# Patient Record
Sex: Male | Born: 1935 | Race: Black or African American | Hispanic: No | Marital: Married | Smoking: Former smoker
Health system: Southern US, Community
[De-identification: ages and names within clinical notes are randomized; demographics above are authoritative.]

## PROBLEM LIST (undated history)

## (undated) DIAGNOSIS — Z8711 Personal history of peptic ulcer disease: Secondary | ICD-10-CM

## (undated) DIAGNOSIS — E7800 Pure hypercholesterolemia, unspecified: Secondary | ICD-10-CM

## (undated) DIAGNOSIS — Z8719 Personal history of other diseases of the digestive system: Secondary | ICD-10-CM

## (undated) DIAGNOSIS — H409 Unspecified glaucoma: Secondary | ICD-10-CM

## (undated) DIAGNOSIS — M1990 Unspecified osteoarthritis, unspecified site: Secondary | ICD-10-CM

## (undated) DIAGNOSIS — I2510 Atherosclerotic heart disease of native coronary artery without angina pectoris: Secondary | ICD-10-CM

## (undated) DIAGNOSIS — Z87898 Personal history of other specified conditions: Secondary | ICD-10-CM

## (undated) DIAGNOSIS — K219 Gastro-esophageal reflux disease without esophagitis: Secondary | ICD-10-CM

## (undated) DIAGNOSIS — E119 Type 2 diabetes mellitus without complications: Secondary | ICD-10-CM

## (undated) DIAGNOSIS — H9190 Unspecified hearing loss, unspecified ear: Secondary | ICD-10-CM

## (undated) DIAGNOSIS — I10 Essential (primary) hypertension: Secondary | ICD-10-CM

## (undated) HISTORY — PX: CORONARY ANGIOPLASTY WITH STENT PLACEMENT: SHX49

## (undated) HISTORY — PX: COLONOSCOPY: SHX174

## (undated) HISTORY — PX: PTCA: SHX146

## (undated) HISTORY — PX: APPENDECTOMY: SHX54

---
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
## 2017-12-25 ENCOUNTER — Ambulatory Visit
Admission: EM | Admit: 2017-12-25 | Discharge: 2017-12-25 | Disposition: A | Discharge status: Home / Self Care | Payer: Medicare Other | Attending: Family Medicine | Admitting: Family Medicine

## 2017-12-25 ENCOUNTER — Encounter: Payer: Self-pay | Admitting: *Deleted

## 2017-12-25 DIAGNOSIS — M6530 Trigger finger, unspecified finger: Secondary | ICD-10-CM

## 2017-12-25 HISTORY — DX: Essential (primary) hypertension: I10

## 2017-12-25 HISTORY — DX: Type 2 diabetes mellitus without complications: E11.9

## 2017-12-25 HISTORY — DX: Atherosclerotic heart disease of native coronary artery without angina pectoris: I25.10

## 2017-12-25 NOTE — ED Triage Notes (Signed)
Pt c/o left ring finger pain and edema x7 months. States occasionally "locks" at PIP joint. Denies injury.

## 2017-12-28 ENCOUNTER — Telehealth: Payer: Self-pay

## 2017-12-28 NOTE — Telephone Encounter (Signed)
Called to follow up with patient [facility]. Patient number is not correct. Patient will call back with any questions or concerns. [facility]

## 2018-03-11 ENCOUNTER — Ambulatory Visit
Admission: EM | Admit: 2018-03-11 | Discharge: 2018-03-11 | Disposition: A | Discharge status: Home / Self Care | Payer: Medicare Other | Attending: Family Medicine | Admitting: Family Medicine

## 2018-03-11 ENCOUNTER — Encounter: Payer: Self-pay | Admitting: *Deleted

## 2018-03-11 DIAGNOSIS — M79605 Pain in left leg: Secondary | ICD-10-CM

## 2018-03-11 DIAGNOSIS — L853 Xerosis cutis: Secondary | ICD-10-CM

## 2018-03-11 MED ORDER — TRAMADOL HCL 50 MG PO TABS: 50.0000 mg | ORAL_TABLET | Freq: Three times a day (TID) | ORAL | 0 refills | Status: DC | PRN

## 2018-03-11 NOTE — Discharge Instructions (Signed)
Medicine as needed.  CeraVe for dry skin.  Take care  Dr. Adriana Simasook

## 2018-03-11 NOTE — ED Triage Notes (Signed)
Left leg pain and edema x3 weeks. Denies injury.

## 2018-03-11 NOTE — ED Provider Notes (Signed)
[facility]    CSN: [id number] Arrival date & time: 03/11/18  1013  History   Chief Complaint Chief Complaint  Patient presents with  . Leg Pain   HPI  84 year old male presents with left leg pain.  Patient reports a 3-week history of left leg pain.  He states that it starts at the back of the thigh and radiates downward.  He states that the pain is sharp.  He has numbness and tingling at times.  He reports that he has ongoing back pain as well.  He states that he saw his primary care doctor a few weeks ago and was told that this was sciatica.  He tried Aleve without improvement.  Seems to be worse with activity.  Improves with rest. No other associated symptoms.   Additionally, patient reports ongoing dry skin.  He states that he uses a diabetic cream but this is not improved.  Diffuse dry skin.  PMH: Obesity (BMI 30-39.9), unspecified 11/29/2015  Elevated PSA 04/13/2014  Overview:   - increased with testosterone Rx, back to baseline (<3) when went off   Low testosterone 01/04/2014  CKD (chronic kidney disease) stage 3, GFR 30-59 ml/min 10/02/2013  Overview:   Formatting of this note might be different from the original. Lab Results  Component Value Date  CREATININE 1.5* 09/15/2013      Primary localized osteoarthrosis, lower leg 10/06/2012  Controlled type 2 diabetes mellitus with diabetic polyneuropathy, without long-term current use of insulin 09/24/2011  Hyperlipidemia, unspecified 09/24/2011  Essential hypertension 09/24/2011  GERD (gastroesophageal reflux disease) 09/24/2011  DJD (degenerative joint disease) 09/24/2011  Chronic rhinitis 09/24/2011  ED (erectile dysfunction) 09/24/2011  CAD (coronary artery disease) 09/24/2011  Presbycusis of both ears 09/24/2011  BPH (benign prostatic hyperplasia) 09/24/2011   Past Surgical History:  Procedure Laterality Date  . APPENDECTOMY    . PTCA     Home Medications    Prior to Admission medications     Medication Sig Start Date End Date Taking? Authorizing Provider  traMADol (ULTRAM) 50 MG tablet Take 1 tablet (50 mg total) by mouth every 8 (eight) hours as needed. 03/11/18   Tommie Sams, DO   Family History Family History  Problem Relation Age of Onset  . Hypertension Mother   . Diabetes Mother   . Heart failure Mother   . Cancer Mother   . Other Father    Social History Social History   Tobacco Use  . Smoking status: Former Games developer  . Smokeless tobacco: Never Used  Substance Use Topics  . Alcohol use: Yes  . Drug use: No   Allergies   Testosterone  Review of Systems Review of Systems  Musculoskeletal:       Leg pain, back pain.  Skin:       Dry Skin.    Physical Exam Triage Vital Signs ED Triage Vitals  Enc Vitals Group     BP 03/11/18 1031 (!) 146/72     Pulse Rate 03/11/18 1031 64     Resp 03/11/18 1031 16     Temp 03/11/18 1031 (!) 97.2 F (36.2 C)     Temp Source 03/11/18 1031 Oral     SpO2 03/11/18 1031 99 %     Weight 03/11/18 1035 225 lb (102.1 kg)     Height 03/11/18 1035 5\' 11"  (1.803 m)     Head Circumference --      Peak Flow --      Pain Score 03/11/18 1034 8  Pain Loc --      Pain Edu? --      Excl. in GC? --    No data found.  Updated Vital Signs BP (!) 146/72 (BP Location: Left Arm)   Pulse 64   Temp (!) 97.2 F (36.2 C) (Oral)   Resp 16   Ht 5\' 11"  (1.803 m)   Wt 225 lb (102.1 kg)   SpO2 99%   BMI 31.38 kg/m   Physical Exam  Constitutional: He is oriented to person, place, and time. He appears well-developed. No distress.  HENT:  Head: Normocephalic and atraumatic.  Cardiovascular:  2+ DP and PT pulses (left).  Pulmonary/Chest: Effort normal. No respiratory distress.  Musculoskeletal:  Low back nontender to palpation. No discrete areas of tenderness of the left leg.  Trace ankle edema.  Neurological: He is alert and oriented to person, place, and time.  Skin:  Diffuse dry skin noted.   Psychiatric: He has a normal  mood and affect. His behavior is normal.  Nursing note and vitals reviewed.  UC Treatments / Results  Labs (all labs ordered are listed, but only abnormal results are displayed) Labs Reviewed - No data to display  EKG None Radiology No results found.  Procedures Procedures (including critical care time)  Medications Ordered in UC Medications - No data to display   Initial Impression / Assessment and Plan / UC Course  I have reviewed the triage vital signs and the nursing notes.  Pertinent labs & imaging results that were available during my care of the patient were reviewed by me and considered in my medical decision making (see chart for details).     84 year old male presents with left leg pain.  This is likely from sciatica.  Treating with tramadol.  I advised him to discuss screening for peripheral artery disease with his primary care physician.  CeraVe for dry skin.  Final Clinical Impressions(s) / UC Diagnoses   Final diagnoses:  Left leg pain  Dry skin    ED Discharge Orders        Ordered    traMADol (ULTRAM) 50 MG tablet  Every 8 hours PRN     03/11/18 1106     Controlled Substance Prescriptions [place] Controlled Substance Registry consulted? Not Applicable   Tommie SamsCook, Erinn Huskins G, DO 03/11/18 1124

## 2018-03-16 ENCOUNTER — Ambulatory Visit
Admission: EM | Admit: 2018-03-16 | Discharge: 2018-03-16 | Disposition: A | Discharge status: Home / Self Care | Payer: Medicare Other | Source: Home / Self Care | Attending: Family Medicine | Admitting: Family Medicine

## 2018-03-16 ENCOUNTER — Emergency Department
Admission: EM | Admit: 2018-03-16 | Discharge: 2018-03-16 | Disposition: A | Discharge status: Home / Self Care | Payer: Medicare Other | Attending: Emergency Medicine | Admitting: Emergency Medicine

## 2018-03-16 ENCOUNTER — Emergency Department: Payer: Medicare Other

## 2018-03-16 ENCOUNTER — Encounter: Payer: Self-pay | Admitting: Medical Oncology

## 2018-03-16 ENCOUNTER — Encounter: Payer: Self-pay | Admitting: Emergency Medicine

## 2018-03-16 ENCOUNTER — Other Ambulatory Visit: Payer: Self-pay

## 2018-03-16 DIAGNOSIS — R112 Nausea with vomiting, unspecified: Secondary | ICD-10-CM | POA: Diagnosis not present

## 2018-03-16 DIAGNOSIS — N189 Chronic kidney disease, unspecified: Secondary | ICD-10-CM | POA: Insufficient documentation

## 2018-03-16 DIAGNOSIS — I129 Hypertensive chronic kidney disease with stage 1 through stage 4 chronic kidney disease, or unspecified chronic kidney disease: Secondary | ICD-10-CM | POA: Insufficient documentation

## 2018-03-16 DIAGNOSIS — E860 Dehydration: Secondary | ICD-10-CM | POA: Diagnosis not present

## 2018-03-16 DIAGNOSIS — K8020 Calculus of gallbladder without cholecystitis without obstruction: Secondary | ICD-10-CM | POA: Insufficient documentation

## 2018-03-16 DIAGNOSIS — E1122 Type 2 diabetes mellitus with diabetic chronic kidney disease: Secondary | ICD-10-CM | POA: Diagnosis not present

## 2018-03-16 DIAGNOSIS — R1011 Right upper quadrant pain: Secondary | ICD-10-CM | POA: Insufficient documentation

## 2018-03-16 DIAGNOSIS — I2510 Atherosclerotic heart disease of native coronary artery without angina pectoris: Secondary | ICD-10-CM | POA: Insufficient documentation

## 2018-03-16 DIAGNOSIS — R197 Diarrhea, unspecified: Secondary | ICD-10-CM

## 2018-03-16 DIAGNOSIS — Z9089 Acquired absence of other organs: Secondary | ICD-10-CM | POA: Insufficient documentation

## 2018-03-16 DIAGNOSIS — N179 Acute kidney failure, unspecified: Secondary | ICD-10-CM

## 2018-03-16 DIAGNOSIS — Z87891 Personal history of nicotine dependence: Secondary | ICD-10-CM | POA: Diagnosis not present

## 2018-03-16 DIAGNOSIS — D72829 Elevated white blood cell count, unspecified: Secondary | ICD-10-CM | POA: Insufficient documentation

## 2018-03-16 LAB — COMPREHENSIVE METABOLIC PANEL
ALBUMIN: 4.5 g/dL (ref 3.5–5.0)
ALT: 22 U/L (ref 17–63)
ALT: 19 U/L (ref 17–63)
AST: 25 U/L (ref 15–41)
AST: 22 U/L (ref 15–41)
Albumin: 4.3 g/dL (ref 3.5–5.0)
Alkaline Phosphatase: 66 U/L (ref 38–126)
Alkaline Phosphatase: 66 U/L (ref 38–126)
Anion gap: 12 (ref 5–15)
Anion gap: 12 (ref 5–15)
BUN: 38 mg/dL — AB (ref 6–20)
BUN: 37 mg/dL — ABNORMAL HIGH (ref 6–20)
CHLORIDE: 97 mmol/L — AB (ref 101–111)
CO2: 30 mmol/L (ref 22–32)
CO2: 33 mmol/L — ABNORMAL HIGH (ref 22–32)
CREATININE: 1.96 mg/dL — AB (ref 0.61–1.24)
Calcium: 9.8 mg/dL (ref 8.9–10.3)
Calcium: 9.7 mg/dL (ref 8.9–10.3)
Chloride: 94 mmol/L — ABNORMAL LOW (ref 101–111)
Creatinine, Ser: 2.13 mg/dL — ABNORMAL HIGH (ref 0.61–1.24)
GFR calc Af Amer: 35 mL/min — ABNORMAL LOW (ref >60)
GFR calc non Af Amer: 30 mL/min — ABNORMAL LOW (ref >60)
GFR, EST AFRICAN AMERICAN: 32 mL/min — AB (ref >60)
GFR, EST NON AFRICAN AMERICAN: 27 mL/min — AB (ref >60)
GLUCOSE: 268 mg/dL — AB (ref 65–99)
Glucose, Bld: 230 mg/dL — ABNORMAL HIGH (ref 65–99)
Potassium: 4.1 mmol/L (ref 3.5–5.1)
Potassium: 4 mmol/L (ref 3.5–5.1)
SODIUM: 139 mmol/L (ref 135–145)
Sodium: 139 mmol/L (ref 135–145)
TOTAL PROTEIN: 7.9 g/dL (ref 6.5–8.1)
Total Bilirubin: 0.7 mg/dL (ref 0.3–1.2)
Total Bilirubin: 0.9 mg/dL (ref 0.3–1.2)
Total Protein: 8.3 g/dL — ABNORMAL HIGH (ref 6.5–8.1)

## 2018-03-16 LAB — CBC
HEMATOCRIT: 43.7 % (ref 40.0–52.0)
HEMOGLOBIN: 14.2 g/dL (ref 13.0–18.0)
MCH: 27.3 pg (ref 26.0–34.0)
MCHC: 32.4 g/dL (ref 32.0–36.0)
MCV: 84.2 fL (ref 80.0–100.0)
Platelets: 294 10*3/uL (ref 150–440)
RBC: 5.19 MIL/uL (ref 4.40–5.90)
RDW: 14.1 % (ref 11.5–14.5)
WBC: 15.5 10*3/uL — ABNORMAL HIGH (ref 3.8–10.6)

## 2018-03-16 LAB — LIPASE, BLOOD: Lipase: 43 U/L (ref 11–51)

## 2018-03-16 MED ORDER — ONDANSETRON HCL 4 MG/2ML IJ SOLN
4.0000 mg | Freq: Once | INTRAMUSCULAR | Status: AC
  Administered 2018-03-16: 4 mg via INTRAVENOUS
  Filled 2018-03-16: qty 2

## 2018-03-16 MED ORDER — ONDANSETRON 4 MG PO TBDP: 4.0000 mg | ORAL_TABLET | Freq: Three times a day (TID) | ORAL | 0 refills | Status: AC | PRN

## 2018-03-16 MED ORDER — SODIUM CHLORIDE 0.9 % IV BOLUS
1000.0000 mL | Freq: Once | INTRAVENOUS | Status: AC
  Administered 2018-03-16: 1000 mL via INTRAVENOUS

## 2018-03-16 MED ORDER — FAMOTIDINE 20 MG PO TABS
20.0000 mg | ORAL_TABLET | Freq: Two times a day (BID) | ORAL | 0 refills | Status: AC
Start: 2018-03-16 — End: ?

## 2018-03-16 MED ORDER — ONDANSETRON 8 MG PO TBDP
8.0000 mg | ORAL_TABLET | Freq: Once | ORAL | Status: AC
  Administered 2018-03-16: 8 mg via ORAL

## 2018-03-16 MED ORDER — FAMOTIDINE IN NACL 20-0.9 MG/50ML-% IV SOLN
20.0000 mg | Freq: Once | INTRAVENOUS | Status: AC
  Administered 2018-03-16: 20 mg via INTRAVENOUS
  Filled 2018-03-16: qty 50

## 2018-03-16 MED ORDER — ONDANSETRON 4 MG PO TBDP: 4.0000 mg | ORAL_TABLET | Freq: Three times a day (TID) | ORAL | 0 refills | Status: DC | PRN

## 2018-03-16 NOTE — ED Notes (Signed)
Patient transported to Ultrasound 

## 2018-03-16 NOTE — ED Triage Notes (Signed)
First Nurse Note:  [facility] for ED evaluation of possible dehydration.  Patient states he has been vomiting since Friday. AAOx3.  Skin warm and dry. NAD

## 2018-03-16 NOTE — Discharge Instructions (Signed)
Go to the hospital for IV fluids and monitoring.  Take care  Dr. Adriana Simasook

## 2018-03-16 NOTE — ED Triage Notes (Signed)
[facility], pt states that he has been having diarrhea for a few days and vomiting since Friday. Pt in NAD at this time.

## 2018-03-16 NOTE — ED Triage Notes (Signed)
Patient c/o vomiting since Friday.  Patient reports diarrhea off and on for a week.

## 2018-03-16 NOTE — ED Notes (Signed)
Reviewed discharge instructions, follow-up care, and prescriptions with patient. Patient verbalized understanding of all information reviewed. Patient stable, with no distress noted at this time.    

## 2018-03-16 NOTE — ED Notes (Signed)
ED Provider at bedside. 

## 2018-03-16 NOTE — ED Provider Notes (Signed)
[facility] Emergency Department Provider Note  ____________________________________________  Time seen: Approximately 7:24 PM  I have reviewed the triage vital signs and the nursing notes.   HISTORY  Chief Complaint Emesis and Diarrhea    HPI Ronald Mcdonald is a 84 y.o. male sent to the ED from urgent care due to vomiting and diarrhea for the past 3 days. Patient has a history of coronary artery disease, hypertension, diabetes.  Patient reports intermittent cramping abdominal pain, lasts for a minute or 2 and then resolves, frequently recurrent. Nonradiating, no aggravating or alleviating factors, moderate intensity. Associated with watery diarrhea, about 5 episodes a day as well as vomiting whenever he tries to eat or drink anything. Denies fevers chills or dizziness.      Past Medical History:  Diagnosis Date  . Coronary artery disease   . Diabetes mellitus without complication (HCC)   . Hypertension      There are no active problems to display for this patient.    Past Surgical History:  Procedure Laterality Date  . APPENDECTOMY    . PTCA       Prior to Admission medications   Medication Sig Start Date End Date Taking? Authorizing Provider  famotidine (PEPCID) 20 MG tablet Take 1 tablet (20 mg total) by mouth 2 (two) times daily. 03/16/18   Sharman Cheek, MD  ondansetron (ZOFRAN ODT) 4 MG disintegrating tablet Take 1 tablet (4 mg total) by mouth every 8 (eight) hours as needed for nausea or vomiting. 03/16/18   Sharman Cheek, MD  ondansetron (ZOFRAN-ODT) 4 MG disintegrating tablet Take 1 tablet (4 mg total) by mouth every 8 (eight) hours as needed for nausea or vomiting. 03/16/18   Tommie Sams, DO  traMADol (ULTRAM) 50 MG tablet Take 1 tablet (50 mg total) by mouth every 8 (eight) hours as needed. 03/11/18   Tommie Sams, DO     Allergies Testosterone   Family History  Problem Relation Age of Onset  . Hypertension Mother   .  Diabetes Mother   . Heart failure Mother   . Cancer Mother   . Other Father     Social History Social History   Tobacco Use  . Smoking status: Former Games developer  . Smokeless tobacco: Never Used  Substance Use Topics  . Alcohol use: Yes  . Drug use: No    Review of Systems  Constitutional:   No fever or chills.  ENT:   No sore throat. No rhinorrhea. Cardiovascular:   No chest pain or syncope. Respiratory:   No dyspnea or cough. Gastrointestinal:   positive as above for abdominal pain and vomiting and diarrhea.  Musculoskeletal:   Negative for focal pain or swelling All other systems reviewed and are negative except as documented above in ROS and HPI.  ____________________________________________   PHYSICAL EXAM:  VITAL SIGNS: ED Triage Vitals [03/16/18 1806]  Enc Vitals Group     BP (!) 135/58     Pulse Rate 87     Resp 16     Temp 98.7 F (37.1 C)     Temp Source Oral     SpO2 97 %     Weight 225 lb (102.1 kg)     Height 5\' 11"  (1.803 m)     Head Circumference      Peak Flow      Pain Score 10     Pain Loc      Pain Edu?      Excl. in  GC?     Vital signs reviewed, nursing assessments reviewed.   Constitutional:   Alert and oriented. Well appearing and in no distress. Eyes:   Conjunctivae are normal. EOMI. PERRL. ENT      Head:   Normocephalic and atraumatic.      Nose:   No congestion/rhinnorhea.       Mouth/Throat:   MMM, no pharyngeal erythema. No peritonsillar mass.       Neck:   No meningismus. Full ROM. Hematological/Lymphatic/Immunilogical:   No cervical lymphadenopathy. Cardiovascular:   RRR. Symmetric bilateral radial and DP pulses.  No murmurs.  Respiratory:   Normal respiratory effort without tachypnea/retractions. Breath sounds are clear and equal bilaterally. No wheezes/rales/rhonchi. Gastrointestinal:   Soft and nontender. Non distended. There is no CVA tenderness.  No rebound, rigidity, or guarding. Genitourinary:    deferred Musculoskeletal:   Normal range of motion in all extremities. No joint effusions.  No lower extremity tenderness.  No edema. Neurologic:   Normal speech and language.  Motor grossly intact. No acute focal neurologic deficits are appreciated.  Skin:    Skin is warm, dry and intact. No rash noted.  No petechiae, purpura, or bullae.  ____________________________________________    LABS (pertinent positives/negatives) (all labs ordered are listed, but only abnormal results are displayed) Labs Reviewed  COMPREHENSIVE METABOLIC PANEL - Abnormal; Notable for the following components:      Result Value   Chloride 94 (*)    CO2 33 (*)    Glucose, Bld 268 (*)    BUN 37 (*)    Creatinine, Ser 2.13 (*)    GFR calc non Af Amer 27 (*)    GFR calc Af Amer 32 (*)    All other components within normal limits  CBC - Abnormal; Notable for the following components:   WBC 15.5 (*)    All other components within normal limits  LIPASE, BLOOD  URINALYSIS, COMPLETE (UACMP) WITH MICROSCOPIC   ____________________________________________   EKG    ____________________________________________    RADIOLOGY  Ct Abdomen Pelvis Wo Contrast  Result Date: 03/16/2018 CLINICAL DATA:  Vomiting since Friday with diarrhea on and off for a week. History of appendectomy. EXAM: CT ABDOMEN AND PELVIS WITHOUT CONTRAST TECHNIQUE: Multidetector CT imaging of the abdomen and pelvis was performed following the standard protocol without IV contrast. COMPARISON:  None. FINDINGS: Lower chest: Top-normal size heart without pericardial effusion. Minimal atelectasis at the right lung base. 4 mm nodular density along the right major fissure in the right lower lobe likely representing a fissural lymph node, series 3/1. Hepatobiliary: Dependent calculi biliary sludge within the gallbladder without secondary signs of acute cholecystitis. Homogeneous appearance of the unenhanced liver. No definite space-occupying mass or  biliary dilatation. Pancreas: No inflammation, ductal dilatation or pancreatic mass. Spleen: Normal size spleen without mass. Adrenals/Urinary Tract: Normal bilateral adrenal glands. Water attenuating cysts bilaterally the largest in the lower pole measuring up to 6.3 cm. No hydroureteronephrosis. Unremarkable bladder for degree of distention. Stomach/Bowel: Stomach is within normal limits. Appendix is surgically absent. No evidence of bowel wall thickening, distention, or inflammatory changes. Vascular/Lymphatic: Moderate aortoiliac atherosclerosis without aneurysm. No lymphadenopathy. Reproductive: Normal size prostate and seminal vesicles. Other: No abdominal wall hernia or abnormality. No abdominopelvic ascites. Musculoskeletal: Degenerative disc disease L5-S1. No pars defects. No acute nor suspicious osseous abnormality. IMPRESSION: 1. No acute solid nor hollow visceral organ abnormality. No bowel obstruction or bowel inflammation. 2. Biliary sludge and calculi within a nondistended gallbladder. No secondary signs of  acute cholecystitis. 3. Bilateral water attenuating renal cysts largest in the lower pole measuring 6.3 cm. Electronically Signed   By: Tollie Eth M.D.   On: 03/16/2018 20:05   US Abdomen Limited Ruq  Result Date: 03/16/2018 CLINICAL DATA:  Right upper quadrant pain. EXAM: ULTRASOUND ABDOMEN LIMITED RIGHT UPPER QUADRANT COMPARISON:  CT scan from earlier today FINDINGS: Gallbladder: Multiple shadowing gallstones are identified measuring up to 15 mm diameter. No gallbladder wall thickening or pericholecystic fluid. The sonographer reports no sonographic Murphy sign. Common bile duct: Diameter: 5 mm Liver: No focal lesion identified. Increased parenchymal echogenicity suggests fatty deposition. Portal vein is patent on color Doppler imaging with normal direction of blood flow towards the liver. Other: Cystic lesions identified in the right kidney. IMPRESSION: 1. Cholelithiasis without gallbladder  wall thickening or pericholecystic fluid. No biliary dilatation. 2. Diffusely increased echogenicity of the liver parenchyma suggests fatty deposition. Electronically Signed   By: Kennith Center M.D.   On: 03/16/2018 21:39    ____________________________________________   PROCEDURES Procedures  ____________________________________________  DIFFERENTIAL DIAGNOSIS   bowel obstruction, AAA, gastroenteritis, biliary disease, diverticulitis. Low suspicion ACS PE dissection or pneumothorax or mesenteric ischemia.  CLINICAL IMPRESSION / ASSESSMENT AND PLAN / ED COURSE  Pertinent labs & imaging results that were available during my care of the patient were reviewed by me and considered in my medical decision making (see chart for details).    patient presents with occasional abdominal pain, vomiting and diarrhea. No risk factors for C. difficile infection.hydration with IV fluids. CT abdomen and pelvis given leukocytosis with white blood cell count of 15.5 thousand. Has acute on chronic kidney disease with creatinine elevated from baseline of 1.5-2.1.  Clinical Course as of Mar 16 2304  Wynelle Link Mar 16, 2018  1845 Increase from baseline ckd of creatinine 1.5. Acute on chronic renal failure.   Creatinine(!): 2.13 [PS]  2057 Ct shows GB sludge. Given sx, wbc 15, will check [place] ruq. May PO trial if reassuring.    [PS]  2207 [place] c/w cholelithiasis without cholecystitis. Exam not highly concerning for cholecystitis. Plan to DC home if feeling better, f/u outpt surgery   [PS]  2215 Pt tolerating oral intake.    [PS]    Clinical Course User Index [PS] Sharman Cheek, MD     ----------------------------------------- 11:05 PM on 03/16/2018 -----------------------------------------  Results and outpatient follow-up plan discussed with the patient and his wife at bedside. They agree and are eager to leave. We'll follow up with primary care. Follow bland diet. Return precautions  discussed.  ____________________________________________   FINAL CLINICAL IMPRESSION(S) / ED DIAGNOSES    Final diagnoses:  Non-intractable vomiting with nausea, unspecified vomiting type  Dehydration  Calculus of gallbladder without cholecystitis without obstruction  Chronic kidney disease, unspecified CKD stage     ED Discharge Orders        Ordered    famotidine (PEPCID) 20 MG tablet  2 times daily     03/16/18 2301    ondansetron (ZOFRAN ODT) 4 MG disintegrating tablet  Every 8 hours PRN     03/16/18 2301      Portions of this note were generated with dragon dictation software. Dictation errors may occur despite best attempts at proofreading.    Sharman Cheek, MD 03/16/18 [id number]

## 2018-03-16 NOTE — ED Notes (Signed)
Patient transported to CT 

## 2018-03-16 NOTE — ED Provider Notes (Signed)
[facility]  CSN: [id number] Arrival date & time: 03/16/18  1552  History   Chief Complaint Chief Complaint  Patient presents with  . Emesis  . Diarrhea   HPI  84 year old male with type 2 diabetes, CAD, CKD, hypertension, hyperlipidemia presents with nausea, vomiting, diarrhea.  Patient states that he has had ongoing diarrhea for the past week.  On Friday he developed nausea and vomiting.  He states that he has been having difficulty keeping food down.  He has been able to take liquids but states that eventually throws that up as well.  He reports intermittent abdominal pain.  He states that his pain is currently 4/10 in severity.  No fever.  No chills.  Does not feel well.  No new medication changes other than recent addition of tramadol for pain.  No other associated symptoms.  No other complaints or concerns at this time.  Social History Social History   Tobacco Use  . Smoking status: Former Games developermoker  . Smokeless tobacco: Never Used  Substance Use Topics  . Alcohol use: Yes  . Drug use: No   Allergies   Testosterone  Review of Systems Review of Systems  Constitutional: Positive for fatigue. Negative for fever.  Gastrointestinal: Positive for abdominal pain, diarrhea, nausea and vomiting.   Physical Exam Triage Vital Signs ED Triage Vitals  Enc Vitals Group     BP 03/16/18 1559 129/70     Pulse Rate 03/16/18 1559 93     Resp 03/16/18 1559 16     Temp 03/16/18 1559 98.2 F (36.8 C)     Temp Source 03/16/18 1559 Oral     SpO2 03/16/18 1559 95 %     Weight 03/16/18 1558 225 lb (102.1 kg)     Height --      Head Circumference --      Peak Flow --      Pain Score 03/16/18 1558 4     Pain Loc --      Pain Edu? --      Excl. in GC? --    Updated Vital Signs BP 129/70 (BP Location: Left Arm)   Pulse 93   Temp 98.2 F (36.8 C) (Oral)   Resp 16   Wt 225 lb (102.1 kg)   SpO2 95%   BMI 31.38 kg/m   Physical Exam  Constitutional: He is oriented to  person, place, and time. He appears well-developed. No distress.  HENT:  Head: Normocephalic and atraumatic.  Eyes: Conjunctivae are normal. No scleral icterus.  Cardiovascular: Normal rate and regular rhythm.  Pulmonary/Chest: Effort normal and breath sounds normal. He has no wheezes. He has no rales.  Abdominal: Soft. He exhibits no distension. There is no tenderness. There is no rebound and no guarding.  Neurological: He is alert and oriented to person, place, and time.  Psychiatric: He has a normal mood and affect. His behavior is normal.  Nursing note and vitals reviewed.  UC Treatments / Results  Labs (all labs ordered are listed, but only abnormal results are displayed) Labs Reviewed  COMPREHENSIVE METABOLIC PANEL - Abnormal; Notable for the following components:      Result Value   Chloride 97 (*)    Glucose, Bld 230 (*)    BUN 38 (*)    Creatinine, Ser 1.96 (*)    Total Protein 8.3 (*)    GFR calc non Af Amer 30 (*)    GFR calc Af Amer 35 (*)    All  other components within normal limits    EKG None Radiology No results found.  Procedures Procedures (including critical care time)  Medications Ordered in UC Medications  ondansetron (ZOFRAN-ODT) disintegrating tablet 8 mg (8 mg Oral Given 03/16/18 1606)   Initial Impression / Assessment and Plan / UC Course  I have reviewed the triage vital signs and the nursing notes.  Pertinent labs & imaging results that were available during my care of the patient were reviewed by me and considered in my medical decision making (see chart for details).    84 year old male presents with nausea, vomiting, diarrhea. Creatinine bump (AKI) from baseline of 1.5 to 1.96. Sending to ER for fluids and monitoring.  Final Clinical Impressions(s) / UC Diagnoses   Final diagnoses:  Nausea vomiting and diarrhea  AKI (acute kidney injury) [facility])   ED Discharge Orders        Ordered    ondansetron (ZOFRAN-ODT) 4 MG disintegrating tablet   Every 8 hours PRN     03/16/18 1627     Controlled Substance Prescriptions [place] Controlled Substance Registry consulted? Not Applicable   Tommie Sams, DO 03/16/18 1640

## 2018-04-02 NOTE — ED Provider Notes (Signed)
[facility]    CSN: [id number] Arrival date & time: 12/25/17  1616     History   Chief Complaint Chief Complaint  Patient presents with  . Hand Pain    HPI Ronald Mcdonald is a 84 y.o. male.   84 yo male with a c/o left ring finger pain and slight swelling fo rthe past 7-8 months. Denies any injuries/trauma. States sometimes it "locks up" and he has to push or pull the joint to release it. Denies any fevers or chills.   The history is provided by the patient.  Hand Pain     Past Medical History:  Diagnosis Date  . Coronary artery disease   . Diabetes mellitus without complication (HCC)   . Hypertension     There are no active problems to display for this patient.   Past Surgical History:  Procedure Laterality Date  . APPENDECTOMY    . PTCA         Home Medications    Prior to Admission medications   Medication Sig Start Date End Date Taking? Authorizing Provider  famotidine (PEPCID) 20 MG tablet Take 1 tablet (20 mg total) by mouth 2 (two) times daily. 03/16/18   Sharman CheekStafford, Phillip, MD  ondansetron (ZOFRAN ODT) 4 MG disintegrating tablet Take 1 tablet (4 mg total) by mouth every 8 (eight) hours as needed for nausea or vomiting. 03/16/18   Sharman CheekStafford, Phillip, MD  ondansetron (ZOFRAN-ODT) 4 MG disintegrating tablet Take 1 tablet (4 mg total) by mouth every 8 (eight) hours as needed for nausea or vomiting. 03/16/18   Tommie Samsook, Jayce G, DO  traMADol (ULTRAM) 50 MG tablet Take 1 tablet (50 mg total) by mouth every 8 (eight) hours as needed. 03/11/18   Tommie Samsook, Jayce G, DO    Family History Family History  Problem Relation Age of Onset  . Hypertension Mother   . Diabetes Mother   . Heart failure Mother   . Cancer Mother   . Other Father     Social History Social History   Tobacco Use  . Smoking status: Former Games developermoker  . Smokeless tobacco: Never Used  Substance Use Topics  . Alcohol use: Yes  . Drug use: No     Allergies   Testosterone   Review of  Systems Review of Systems   Physical Exam Triage Vital Signs ED Triage Vitals  Enc Vitals Group     BP 12/25/17 1714 (!) 152/63     Pulse Rate 12/25/17 1714 60     Resp 12/25/17 1714 16     Temp 12/25/17 1714 98.6 F (37 C)     Temp Source 12/25/17 1714 Oral     SpO2 12/25/17 1714 98 %     Weight 12/25/17 1717 226 lb (102.5 kg)     Height 12/25/17 1717 5\' 11"  (1.803 m)     Head Circumference --      Peak Flow --      Pain Score --      Pain Loc --      Pain Edu? --      Excl. in GC? --    No data found.  Updated Vital Signs BP (!) 152/63 (BP Location: Left Arm)   Pulse 60   Temp 98.6 F (37 C) (Oral)   Resp 16   Ht 5\' 11"  (1.803 m)   Wt 226 lb (102.5 kg)   SpO2 98%   BMI 31.52 kg/m   Visual Acuity Right Eye Distance:  Left Eye Distance:   Bilateral Distance:    Right Eye Near:   Left Eye Near:    Bilateral Near:     Physical Exam  Constitutional: He appears well-developed and well-nourished. No distress.  Musculoskeletal:       Left hand: He exhibits normal range of motion, no tenderness, no bony tenderness, normal two-point discrimination, normal capillary refill, no deformity, no laceration and no swelling. Normal sensation noted. Normal strength noted.  PIP joint of left right finger will lock up in flexion with subsequent release with mild force  Skin: He is not diaphoretic.  Nursing note and vitals reviewed.    UC Treatments / Results  Labs (all labs ordered are listed, but only abnormal results are displayed) Labs Reviewed - No data to display  EKG None Radiology No results found.  Procedures Procedures (including critical care time)  Medications Ordered in UC Medications - No data to display   Initial Impression / Assessment and Plan / UC Course  I have reviewed the triage vital signs and the nursing notes.  Pertinent labs & imaging results that were available during my care of the patient were reviewed by me and considered in my  medical decision making (see chart for details).       Final Clinical Impressions(s) / UC Diagnoses   Final diagnoses:  Trigger finger, acquired    ED Discharge Orders    None     1. diagnosis reviewed with patient 2. Recommend supportive treatment with otc analgesics prn  3. Recommend follow up with orthopedist for further evaluation and management (possible injection) 4. Follow-up prn if symptoms worsen or don't improve   Controlled Substance Prescriptions [place] Controlled Substance Registry consulted? Not Applicable   Payton Mccallum, MD 04/02/18 [phone]

## 2019-07-09 ENCOUNTER — Other Ambulatory Visit
Admission: RE | Admit: 2019-07-09 | Discharge: 2019-07-09 | Disposition: A | Discharge status: Home / Self Care | Payer: Medicare Other | Source: Ambulatory Visit | Attending: Ophthalmology | Admitting: Ophthalmology

## 2019-07-09 ENCOUNTER — Other Ambulatory Visit: Payer: Self-pay

## 2019-07-09 DIAGNOSIS — Z20828 Contact with and (suspected) exposure to other viral communicable diseases: Secondary | ICD-10-CM | POA: Diagnosis present

## 2019-07-09 LAB — SARS CORONAVIRUS 2 (TAT 6-24 HRS): SARS Coronavirus 2: NEGATIVE

## 2019-07-09 NOTE — Discharge Instructions (Signed)

## 2019-07-13 ENCOUNTER — Encounter
Admission: RE | Disposition: A | Discharge status: Home / Self Care | Payer: Self-pay | Source: Home / Self Care | Attending: Ophthalmology

## 2019-07-13 ENCOUNTER — Ambulatory Visit: Payer: Medicare Other | Admitting: Anesthesiology

## 2019-07-13 ENCOUNTER — Ambulatory Visit
Admission: RE | Admit: 2019-07-13 | Discharge: 2019-07-13 | Disposition: A | Discharge status: Home / Self Care | Payer: Medicare Other | Attending: Ophthalmology | Admitting: Ophthalmology

## 2019-07-13 ENCOUNTER — Other Ambulatory Visit: Payer: Self-pay

## 2019-07-13 DIAGNOSIS — H2181 Floppy iris syndrome: Secondary | ICD-10-CM | POA: Insufficient documentation

## 2019-07-13 DIAGNOSIS — Z888 Allergy status to other drugs, medicaments and biological substances status: Secondary | ICD-10-CM | POA: Insufficient documentation

## 2019-07-13 DIAGNOSIS — Z79899 Other long term (current) drug therapy: Secondary | ICD-10-CM | POA: Insufficient documentation

## 2019-07-13 DIAGNOSIS — Z87891 Personal history of nicotine dependence: Secondary | ICD-10-CM | POA: Insufficient documentation

## 2019-07-13 DIAGNOSIS — Z955 Presence of coronary angioplasty implant and graft: Secondary | ICD-10-CM | POA: Diagnosis not present

## 2019-07-13 DIAGNOSIS — I10 Essential (primary) hypertension: Secondary | ICD-10-CM | POA: Diagnosis not present

## 2019-07-13 DIAGNOSIS — E7800 Pure hypercholesterolemia, unspecified: Secondary | ICD-10-CM | POA: Diagnosis not present

## 2019-07-13 DIAGNOSIS — Z7982 Long term (current) use of aspirin: Secondary | ICD-10-CM | POA: Insufficient documentation

## 2019-07-13 DIAGNOSIS — K219 Gastro-esophageal reflux disease without esophagitis: Secondary | ICD-10-CM | POA: Insufficient documentation

## 2019-07-13 DIAGNOSIS — H2511 Age-related nuclear cataract, right eye: Secondary | ICD-10-CM | POA: Diagnosis present

## 2019-07-13 DIAGNOSIS — M1990 Unspecified osteoarthritis, unspecified site: Secondary | ICD-10-CM | POA: Diagnosis not present

## 2019-07-13 DIAGNOSIS — E1136 Type 2 diabetes mellitus with diabetic cataract: Secondary | ICD-10-CM | POA: Insufficient documentation

## 2019-07-13 DIAGNOSIS — Z794 Long term (current) use of insulin: Secondary | ICD-10-CM | POA: Diagnosis not present

## 2019-07-13 HISTORY — DX: Personal history of other specified conditions: Z87.898

## 2019-07-13 HISTORY — DX: Gastro-esophageal reflux disease without esophagitis: K21.9

## 2019-07-13 HISTORY — DX: Unspecified hearing loss, unspecified ear: H91.90

## 2019-07-13 HISTORY — PX: CATARACT EXTRACTION W/PHACO: SHX586

## 2019-07-13 HISTORY — DX: Personal history of peptic ulcer disease: Z87.11

## 2019-07-13 HISTORY — DX: Unspecified osteoarthritis, unspecified site: M19.90

## 2019-07-13 HISTORY — DX: Pure hypercholesterolemia, unspecified: E78.00

## 2019-07-13 HISTORY — DX: Unspecified glaucoma: H40.9

## 2019-07-13 LAB — GLUCOSE, CAPILLARY
Glucose-Capillary: 164 mg/dL — ABNORMAL HIGH (ref 70–99)
Glucose-Capillary: 185 mg/dL — ABNORMAL HIGH (ref 70–99)

## 2019-07-13 SURGERY — PHACOEMULSIFICATION, CATARACT, WITH IOL INSERTION
Anesthesia: Monitor Anesthesia Care | Site: Eye | Laterality: Right

## 2019-07-13 MED ORDER — MIDAZOLAM HCL 2 MG/2ML IJ SOLN
INTRAMUSCULAR | Status: DC | PRN
  Administered 2019-07-13: 1 mg via INTRAVENOUS

## 2019-07-13 MED ORDER — ARMC OPHTHALMIC DILATING DROPS
1.0000 "application " | OPHTHALMIC | Status: DC | PRN
  Administered 2019-07-13 (×3): 1 via OPHTHALMIC

## 2019-07-13 MED ORDER — SODIUM HYALURONATE 23 MG/ML IO SOLN
INTRAOCULAR | Status: DC | PRN
  Administered 2019-07-13: 0.6 mL via INTRAOCULAR

## 2019-07-13 MED ORDER — PHENYLEPHRINE HCL 10 % OP SOLN
1.0000 [drp] | OPHTHALMIC | Status: DC | PRN
  Administered 2019-07-13 (×2): 1 [drp] via OPHTHALMIC

## 2019-07-13 MED ORDER — TETRACAINE HCL 0.5 % OP SOLN
1.0000 [drp] | OPHTHALMIC | Status: DC | PRN
  Administered 2019-07-13 (×3): 1 [drp] via OPHTHALMIC

## 2019-07-13 MED ORDER — MOXIFLOXACIN HCL 0.5 % OP SOLN
OPHTHALMIC | Status: DC | PRN
  Administered 2019-07-13: 0.2 mL via OPHTHALMIC

## 2019-07-13 MED ORDER — FENTANYL CITRATE (PF) 100 MCG/2ML IJ SOLN
INTRAMUSCULAR | Status: DC | PRN
  Administered 2019-07-13: 50 ug via INTRAVENOUS

## 2019-07-13 MED ORDER — EPINEPHRINE PF 1 MG/ML IJ SOLN
INTRAOCULAR | Status: DC | PRN
  Administered 2019-07-13: 104 mL via OPHTHALMIC

## 2019-07-13 MED ORDER — LIDOCAINE HCL (PF) 2 % IJ SOLN
INTRAOCULAR | Status: DC | PRN
  Administered 2019-07-13: 1 mL via INTRAOCULAR

## 2019-07-13 MED ORDER — SODIUM HYALURONATE 10 MG/ML IO SOLN
INTRAOCULAR | Status: DC | PRN
  Administered 2019-07-13: 0.55 mL via INTRAOCULAR

## 2019-07-13 SURGICAL SUPPLY — 20 items
CANNULA ANT/CHMB 27G (MISCELLANEOUS) ×2 IMPLANT
CANNULA ANT/CHMB 27GA (MISCELLANEOUS) ×6 IMPLANT
DISSECTOR HYDRO NUCLEUS 50X22 (MISCELLANEOUS) ×3 IMPLANT
GLOVE SURG LX 7.5 STRW (GLOVE) ×2
GLOVE SURG LX STRL 7.5 STRW (GLOVE) ×1 IMPLANT
GLOVE SURG SYN 8.5  E (GLOVE) ×2
GLOVE SURG SYN 8.5 E (GLOVE) ×1 IMPLANT
GLOVE SURG SYN 8.5 PF PI (GLOVE) ×1 IMPLANT
GOWN STRL REUS W/ TWL LRG LVL3 (GOWN DISPOSABLE) ×2 IMPLANT
GOWN STRL REUS W/TWL LRG LVL3 (GOWN DISPOSABLE) ×4
LENS IOL TECNIS ITEC 25.0 (Intraocular Lens) ×2 IMPLANT
MARKER SKIN DUAL TIP RULER LAB (MISCELLANEOUS) ×3 IMPLANT
PACK DR. KING ARMS (PACKS) ×3 IMPLANT
PACK EYE AFTER SURG (MISCELLANEOUS) ×3 IMPLANT
PACK OPTHALMIC (MISCELLANEOUS) ×3 IMPLANT
RING MALYGIN 7.0 (MISCELLANEOUS) ×2 IMPLANT
SYR 3ML LL SCALE MARK (SYRINGE) ×3 IMPLANT
SYR TB 1ML LUER SLIP (SYRINGE) ×3 IMPLANT
WATER STERILE IRR 250ML POUR (IV SOLUTION) ×3 IMPLANT
WIPE NON LINTING 3.25X3.25 (MISCELLANEOUS) ×3 IMPLANT

## 2019-07-13 NOTE — Anesthesia Preprocedure Evaluation (Signed)
Anesthesia Evaluation    Airway Mallampati: III  TM Distance: >3 FB Neck ROM: Full    Dental  (+) Partial Lower, Partial Upper   Pulmonary former smoker,    Pulmonary exam normal        Cardiovascular hypertension, Normal cardiovascular exam     Neuro/Psych    GI/Hepatic GERD  ,  Endo/Other  diabetes  Renal/GU      Musculoskeletal   Abdominal   Peds  Hematology   Anesthesia Other Findings   Reproductive/Obstetrics                            Anesthesia Physical Anesthesia Plan  ASA: II  Anesthesia Plan: MAC   Post-op Pain Management:    Induction: Intravenous  PONV Risk Score and Plan:   Airway Management Planned:   Additional Equipment: None  Intra-op Plan:   Post-operative Plan:   Informed Consent: I have reviewed the patients History and Physical, chart, labs and discussed the procedure including the risks, benefits and alternatives for the proposed anesthesia with the patient or authorized representative who has indicated his/her understanding and acceptance.       Plan Discussed with:   Anesthesia Plan Comments:         Anesthesia Quick Evaluation

## 2019-07-13 NOTE — Op Note (Signed)
OPERATIVE NOTE  Ronald Mcdonald [id number] 07/13/2019   PREOPERATIVE DIAGNOSIS:  Nuclear sclerotic cataract right eye.  H25.11   POSTOPERATIVE DIAGNOSIS:     1.  Nuclear sclerotic cataract right eye.   2.  Intraoperative Floppy Iris syndrome.   PROCEDURE:   Complex Phacoemusification with posterior chamber intraocular lens placement of the right eye, requiring use of malyugin ring for mechanical iris dilation CPT T6601651.  LENS:   Implant Name Type Inv. Item Serial No. Manufacturer Lot No. LRB No. Used Action  LENS IOL DIOP 25.0 - [id number] Intraocular Lens LENS IOL DIOP 25.0 [id number] AMO  Right 1 Implanted       PCB00 +25.0   ULTRASOUND TIME: 1 minutes 09 seconds.  CDE 9.37   SURGEON:  Benay Pillow, MD, MPH  ANESTHESIOLOGIST: Anesthesiologist: Page, Adele Barthel, MD CRNA: Cameron Ali, CRNA   ANESTHESIA:  Topical with tetracaine drops augmented with 1% preservative-free intracameral lidocaine.  ESTIMATED BLOOD LOSS: less than 1 mL.   COMPLICATIONS:  None.   DESCRIPTION OF PROCEDURE:  The patient was identified in the holding room and transported to the operating room and placed in the supine position under the operating microscope.  The right eye was identified as the operative eye and it was prepped and draped in the usual sterile ophthalmic fashion.   A 1.0 millimeter clear-corneal paracentesis was made at the 10:30 position. 0.5 ml of preservative-free 1% lidocaine with epinephrine was injected into the anterior chamber.  The anterior chamber was filled with Healon 5 viscoelastic.  The pupil was not adequately dilated despite additional doses of 10% phenylephrine preoperatively and the intracameral epinephrine and viscodilation, so a 7 mm Malyugin ring was placed.   A 2.4 millimeter keratome was used to make a near-clear corneal incision at the 8:00 position.  A curvilinear capsulorrhexis was made with a cystotome and capsulorrhexis forceps.  Balanced salt solution was used to  hydrodissect and hydrodelineate the nucleus.   Phacoemulsification was then used in stop and chop fashion to remove the lens nucleus and epinucleus.  The remaining cortex was then removed using the irrigation and aspiration handpiece. Healon was then placed into the capsular bag to distend it for lens placement.  A lens was then injected into the capsular bag.    The malyugin ring was removed without complication.  The remaining viscoelastic was aspirated.   Wounds were hydrated with balanced salt solution.  The anterior chamber was inflated to a physiologic pressure with balanced salt solution.   Intracameral vigamox 0.1 mL undiluted was injected into the eye and a drop placed onto the ocular surface.  No wound leaks were noted.  The patient was taken to the recovery room in stable condition without complications of anesthesia or surgery  Benay Pillow 07/13/2019, 9:54 AM

## 2019-07-13 NOTE — Anesthesia Procedure Notes (Signed)
Procedure Name: MAC Date/Time: 07/13/2019 9:22 AM Performed by: Cameron Ali, CRNA Pre-anesthesia Checklist: Patient identified, Emergency Drugs available, Suction available, Timeout performed and Patient being monitored Patient Re-evaluated:Patient Re-evaluated prior to induction Oxygen Delivery Method: Nasal cannula Placement Confirmation: positive ETCO2

## 2019-07-13 NOTE — H&P (Signed)

## 2019-07-13 NOTE — Anesthesia Postprocedure Evaluation (Signed)
Anesthesia Post Note  Patient: Ronald Mcdonald  Procedure(s) Performed: CATARACT EXTRACTION PHACO AND INTRAOCULAR LENS PLACEMENT (IOC)  RIGHT diabetes (Right Eye)  Patient location during evaluation: PACU Anesthesia Type: MAC Level of consciousness: awake and alert Pain management: pain level controlled Vital Signs Assessment: post-procedure vital signs reviewed and stable Respiratory status: spontaneous breathing Cardiovascular status: blood pressure returned to baseline Postop Assessment: no apparent nausea or vomiting, adequate PO intake and no headache Anesthetic complications: no    Adele Barthel Yocelin Vanlue

## 2019-07-13 NOTE — Transfer of Care (Signed)
Immediate Anesthesia Transfer of Care Note  Patient: Ronald Mcdonald  Procedure(s) Performed: CATARACT EXTRACTION PHACO AND INTRAOCULAR LENS PLACEMENT (IOC)  RIGHT diabetes (Right Eye)  Patient Location: PACU  Anesthesia Type: MAC  Level of Consciousness: awake, alert  and patient cooperative  Airway and Oxygen Therapy: Patient Spontanous Breathing and Patient connected to supplemental oxygen  Post-op Assessment: Post-op Vital signs reviewed, Patient's Cardiovascular Status Stable, Respiratory Function Stable, Patent Airway and No signs of Nausea or vomiting  Post-op Vital Signs: Reviewed and stable  Complications: No apparent anesthesia complications

## 2019-07-14 ENCOUNTER — Encounter: Payer: Self-pay | Admitting: Ophthalmology

## 2019-08-03 ENCOUNTER — Other Ambulatory Visit: Payer: Self-pay

## 2019-08-03 ENCOUNTER — Encounter: Payer: Self-pay | Admitting: *Deleted

## 2019-08-06 ENCOUNTER — Other Ambulatory Visit
Admission: RE | Admit: 2019-08-06 | Discharge: 2019-08-06 | Disposition: A | Discharge status: Home / Self Care | Payer: Medicare Other | Source: Ambulatory Visit | Attending: Ophthalmology | Admitting: Ophthalmology

## 2019-08-06 ENCOUNTER — Other Ambulatory Visit: Payer: Self-pay

## 2019-08-06 DIAGNOSIS — Z01812 Encounter for preprocedural laboratory examination: Secondary | ICD-10-CM | POA: Diagnosis present

## 2019-08-06 DIAGNOSIS — Z20828 Contact with and (suspected) exposure to other viral communicable diseases: Secondary | ICD-10-CM | POA: Diagnosis not present

## 2019-08-06 LAB — SARS CORONAVIRUS 2 (TAT 6-24 HRS): SARS Coronavirus 2: NEGATIVE

## 2019-08-06 NOTE — Discharge Instructions (Signed)

## 2019-08-10 ENCOUNTER — Encounter
Admission: RE | Disposition: A | Discharge status: Home / Self Care | Payer: Self-pay | Source: Home / Self Care | Attending: Ophthalmology

## 2019-08-10 ENCOUNTER — Ambulatory Visit
Admission: RE | Admit: 2019-08-10 | Discharge: 2019-08-10 | Disposition: A | Discharge status: Home / Self Care | Payer: Medicare Other | Attending: Ophthalmology | Admitting: Ophthalmology

## 2019-08-10 ENCOUNTER — Other Ambulatory Visit: Payer: Self-pay

## 2019-08-10 ENCOUNTER — Ambulatory Visit: Payer: Medicare Other | Admitting: Anesthesiology

## 2019-08-10 DIAGNOSIS — H2512 Age-related nuclear cataract, left eye: Secondary | ICD-10-CM | POA: Insufficient documentation

## 2019-08-10 DIAGNOSIS — Z7982 Long term (current) use of aspirin: Secondary | ICD-10-CM | POA: Insufficient documentation

## 2019-08-10 DIAGNOSIS — Z794 Long term (current) use of insulin: Secondary | ICD-10-CM | POA: Diagnosis not present

## 2019-08-10 DIAGNOSIS — Z955 Presence of coronary angioplasty implant and graft: Secondary | ICD-10-CM | POA: Diagnosis not present

## 2019-08-10 DIAGNOSIS — Z79899 Other long term (current) drug therapy: Secondary | ICD-10-CM | POA: Insufficient documentation

## 2019-08-10 DIAGNOSIS — I10 Essential (primary) hypertension: Secondary | ICD-10-CM | POA: Insufficient documentation

## 2019-08-10 DIAGNOSIS — E1136 Type 2 diabetes mellitus with diabetic cataract: Secondary | ICD-10-CM | POA: Insufficient documentation

## 2019-08-10 DIAGNOSIS — H2181 Floppy iris syndrome: Secondary | ICD-10-CM | POA: Diagnosis not present

## 2019-08-10 DIAGNOSIS — K219 Gastro-esophageal reflux disease without esophagitis: Secondary | ICD-10-CM | POA: Diagnosis not present

## 2019-08-10 DIAGNOSIS — Z87891 Personal history of nicotine dependence: Secondary | ICD-10-CM | POA: Insufficient documentation

## 2019-08-10 DIAGNOSIS — M1990 Unspecified osteoarthritis, unspecified site: Secondary | ICD-10-CM | POA: Insufficient documentation

## 2019-08-10 DIAGNOSIS — I2510 Atherosclerotic heart disease of native coronary artery without angina pectoris: Secondary | ICD-10-CM | POA: Insufficient documentation

## 2019-08-10 DIAGNOSIS — E7800 Pure hypercholesterolemia, unspecified: Secondary | ICD-10-CM | POA: Diagnosis not present

## 2019-08-10 HISTORY — PX: CATARACT EXTRACTION W/PHACO: SHX586

## 2019-08-10 LAB — GLUCOSE, CAPILLARY
Glucose-Capillary: 149 mg/dL — ABNORMAL HIGH (ref 70–99)
Glucose-Capillary: 155 mg/dL — ABNORMAL HIGH (ref 70–99)

## 2019-08-10 SURGERY — PHACOEMULSIFICATION, CATARACT, WITH IOL INSERTION
Anesthesia: Monitor Anesthesia Care | Site: Eye | Laterality: Left

## 2019-08-10 MED ORDER — ARMC OPHTHALMIC DILATING DROPS
1.0000 "application " | OPHTHALMIC | Status: DC | PRN
  Administered 2019-08-10 (×3): 1 via OPHTHALMIC

## 2019-08-10 MED ORDER — EPINEPHRINE PF 1 MG/ML IJ SOLN
INTRAOCULAR | Status: DC | PRN
  Administered 2019-08-10: 10:00:00 97 mL via OPHTHALMIC

## 2019-08-10 MED ORDER — ONDANSETRON HCL 4 MG/2ML IJ SOLN: 4.0000 mg | Freq: Once | INTRAMUSCULAR | Status: DC | PRN

## 2019-08-10 MED ORDER — MIDAZOLAM HCL 2 MG/2ML IJ SOLN
INTRAMUSCULAR | Status: DC | PRN
  Administered 2019-08-10: 1 mg via INTRAVENOUS

## 2019-08-10 MED ORDER — MOXIFLOXACIN HCL 0.5 % OP SOLN
OPHTHALMIC | Status: DC | PRN
  Administered 2019-08-10: 0.2 mL via OPHTHALMIC

## 2019-08-10 MED ORDER — ACETAMINOPHEN 10 MG/ML IV SOLN: 1000.0000 mg | Freq: Once | INTRAVENOUS | Status: DC | PRN

## 2019-08-10 MED ORDER — SODIUM HYALURONATE 10 MG/ML IO SOLN
INTRAOCULAR | Status: DC | PRN
  Administered 2019-08-10: 0.55 mL via INTRAOCULAR

## 2019-08-10 MED ORDER — LACTATED RINGERS IV SOLN: 100.0000 mL/h | INTRAVENOUS | Status: DC

## 2019-08-10 MED ORDER — FENTANYL CITRATE (PF) 100 MCG/2ML IJ SOLN
INTRAMUSCULAR | Status: DC | PRN
  Administered 2019-08-10: 50 ug via INTRAVENOUS

## 2019-08-10 MED ORDER — LIDOCAINE HCL (PF) 2 % IJ SOLN
INTRAOCULAR | Status: DC | PRN
  Administered 2019-08-10: 10:00:00 1 mL via INTRAOCULAR

## 2019-08-10 MED ORDER — SODIUM HYALURONATE 23 MG/ML IO SOLN
INTRAOCULAR | Status: DC | PRN
  Administered 2019-08-10: 0.6 mL via INTRAOCULAR

## 2019-08-10 MED ORDER — TETRACAINE HCL 0.5 % OP SOLN
1.0000 [drp] | OPHTHALMIC | Status: DC | PRN
  Administered 2019-08-10 (×3): 1 [drp] via OPHTHALMIC

## 2019-08-10 SURGICAL SUPPLY — 20 items
CANNULA ANT/CHMB 27G (MISCELLANEOUS) ×2 IMPLANT
CANNULA ANT/CHMB 27GA (MISCELLANEOUS) ×4 IMPLANT
DISSECTOR HYDRO NUCLEUS 50X22 (MISCELLANEOUS) ×2 IMPLANT
GLOVE SURG LX 7.5 STRW (GLOVE) ×1
GLOVE SURG LX STRL 7.5 STRW (GLOVE) ×1 IMPLANT
GLOVE SURG SYN 8.5  E (GLOVE) ×1
GLOVE SURG SYN 8.5 E (GLOVE) ×1 IMPLANT
GLOVE SURG SYN 8.5 PF PI (GLOVE) ×1 IMPLANT
GOWN STRL REUS W/ TWL LRG LVL3 (GOWN DISPOSABLE) ×2 IMPLANT
GOWN STRL REUS W/TWL LRG LVL3 (GOWN DISPOSABLE) ×2
LENS IOL TECNIS ITEC 24.5 (Intraocular Lens) ×1 IMPLANT
MARKER SKIN DUAL TIP RULER LAB (MISCELLANEOUS) ×2 IMPLANT
PACK DR. KING ARMS (PACKS) ×2 IMPLANT
PACK EYE AFTER SURG (MISCELLANEOUS) ×2 IMPLANT
PACK OPTHALMIC (MISCELLANEOUS) ×2 IMPLANT
RING MALYGIN (MISCELLANEOUS) ×1 IMPLANT
SYR 3ML LL SCALE MARK (SYRINGE) ×2 IMPLANT
SYR TB 1ML LUER SLIP (SYRINGE) ×2 IMPLANT
WATER STERILE IRR 250ML POUR (IV SOLUTION) ×2 IMPLANT
WIPE NON LINTING 3.25X3.25 (MISCELLANEOUS) ×2 IMPLANT

## 2019-08-10 NOTE — Transfer of Care (Signed)
Immediate Anesthesia Transfer of Care Note  Patient: Dragan Tamburrino  Procedure(s) Performed: CATARACT EXTRACTION PHACO AND INTRAOCULAR LENS PLACEMENT (IOC) left malyugin diabetic  00:59.0  10.7%  7.09 (Left Eye)  Patient Location: PACU  Anesthesia Type: MAC  Level of Consciousness: awake, alert  and patient cooperative  Airway and Oxygen Therapy: Patient Spontanous Breathing and Patient connected to supplemental oxygen  Post-op Assessment: Post-op Vital signs reviewed, Patient's Cardiovascular Status Stable, Respiratory Function Stable, Patent Airway and No signs of Nausea or vomiting  Post-op Vital Signs: Reviewed and stable  Complications: No apparent anesthesia complications

## 2019-08-10 NOTE — H&P (Signed)

## 2019-08-10 NOTE — Op Note (Signed)
OPERATIVE NOTE  Ronald Mcdonald [id number] 08/10/2019   PREOPERATIVE DIAGNOSIS:  Nuclear sclerotic cataract left eye.  H25.12   POSTOPERATIVE DIAGNOSIS:     1.  Nuclear sclerotic cataract left eye.   2.  Intraoperative floppy iris syndrome.   PROCEDURE:   Complex phacoemusification with posterior chamber intraocular lens placement of the left eye, requiring use of a malyugin ring for dilation of the pupil, CPT T6601651   LENS:   Implant Name Type Inv. Item Serial No. Manufacturer Lot No. LRB No. Used Action  LENS IOL DIOP 24.5 - [id number] Intraocular Lens LENS IOL DIOP 24.5 [id number] AMO  Left 1 Implanted       PCB00 +24.5   ULTRASOUND TIME: 0 minutes 59 seconds.  CDE 7.09   SURGEON:  Benay Pillow, MD, MPH   ANESTHESIA:  Topical with tetracaine drops augmented with 1% preservative-free intracameral lidocaine.  ESTIMATED BLOOD LOSS: <1 mL   COMPLICATIONS:  None.   DESCRIPTION OF PROCEDURE:  The patient was identified in the holding room and transported to the operating room and placed in the supine position under the operating microscope.  The left eye was identified as the operative eye and it was prepped and draped in the usual sterile ophthalmic fashion.   A 1.0 millimeter clear-corneal paracentesis was made at the 5:00 position. 0.5 ml of preservative-free 1% lidocaine with epinephrine was injected into the anterior chamber.  The anterior chamber was filled with Healon 5 viscoelastic.  A 2.4 millimeter keratome was used to make a near-clear corneal incision at the 2:00 position.    Because the pupil was very small and floppy, a 6.25 mm Malyugin ring was placed.  A curvilinear capsulorrhexis was made with a cystotome and capsulorrhexis forceps.  Balanced salt solution was used to hydrodissect and hydrodelineate the nucleus.   Phacoemulsification was then used in stop and chop fashion to remove the lens nucleus and epinucleus.  The remaining cortex was then removed using the  irrigation and aspiration handpiece. Healon was then placed into the capsular bag to distend it for lens placement.  A lens was then injected into the capsular bag.    The Malyugin ring was removed, some manipulation of the iris with a small amount of bleeding occurred.  The remaining viscoelastic was aspirated.   Wounds were hydrated with balanced salt solution.  The anterior chamber was inflated to a physiologic pressure with balanced salt solution.  Intracameral vigamox 0.1 mL undiltued was injected into the eye and a drop placed onto the ocular surface.  No wound leaks were noted.  The patient was taken to the recovery room in stable condition without complications of anesthesia or surgery  Benay Pillow 08/10/2019, 10:31 AM

## 2019-08-10 NOTE — Anesthesia Procedure Notes (Signed)
Procedure Name: MAC Date/Time: 08/10/2019 10:03 AM Performed by: Georga Bora, CRNA Pre-anesthesia Checklist: Patient identified, Emergency Drugs available, Suction available, Patient being monitored and Timeout performed Oxygen Delivery Method: Nasal cannula

## 2019-08-10 NOTE — Anesthesia Preprocedure Evaluation (Addendum)
Anesthesia Evaluation  Patient identified by MRN, date of birth, ID band Patient awake    Reviewed: Allergy & Precautions, NPO status , Patient's Chart, lab work & pertinent test results  Airway Mallampati: III  TM Distance: >3 FB Neck ROM: Full    Dental   Pulmonary former smoker,    breath sounds clear to auscultation       Cardiovascular Exercise Tolerance: Good hypertension, (-) angina+ CAD  (-) DOE  Rhythm:Regular Rate:Normal   HLD  Stress test (04/2018): Indeterminate RESTING EF IN THE 45-50% RANGE AT REST WITH DYSSYNCHRONOUS POSTERIOR WALL. THE LVEF HOWEVER WAS NORMAL WITH EXERCISE. THE PATIENT WAS ABLE TO EXERCISE TO TARGET HEART RATE BUT COULD NOT LIE DOWN IMMEDIATELY POST EXERCISE. THEREFORE STRESS DATA WAS NOT OBTAINED WITHIN 1 MINUTE OF EXERCISE MAKING THIS AN INDETERMINATE TEST.   Neuro/Psych    GI/Hepatic PUD, GERD  Medicated and Controlled,  Endo/Other  diabetes  Renal/GU      Musculoskeletal  (+) Arthritis ,   Abdominal (+) + obese (BMI 31),   Peds  Hematology   Anesthesia Other Findings   Reproductive/Obstetrics                            Anesthesia Physical Anesthesia Plan  ASA: III  Anesthesia Plan: MAC   Post-op Pain Management:    Induction: Intravenous  PONV Risk Score and Plan:   Airway Management Planned: Nasal Cannula  Additional Equipment:   Intra-op Plan:   Post-operative Plan:   Informed Consent: I have reviewed the patients History and Physical, chart, labs and discussed the procedure including the risks, benefits and alternatives for the proposed anesthesia with the patient or authorized representative who has indicated his/her understanding and acceptance.       Plan Discussed with: CRNA and Anesthesiologist  Anesthesia Plan Comments:         Anesthesia Quick Evaluation   Active Ambulatory Problems    Diagnosis Date Noted  .  No Active Ambulatory Problems   Resolved Ambulatory Problems    Diagnosis Date Noted  . No Resolved Ambulatory Problems   Past Medical History:  Diagnosis Date  . Arthritis   . Coronary artery disease   . Diabetes mellitus without complication ([place])   . GERD (gastroesophageal reflux disease)   . Glaucoma   . Hard of hearing   . History of stomach ulcers   . History of vertigo   . Hypercholesteremia   . Hypertension     CBC    Component Value Date/Time   WBC 15.5 (H) 03/16/2018 1808   RBC 5.19 03/16/2018 1808   HGB 14.2 03/16/2018 1808   HCT 43.7 03/16/2018 1808   PLT 294 03/16/2018 1808   MCV 84.2 03/16/2018 1808   MCH 27.3 03/16/2018 1808   MCHC 32.4 03/16/2018 1808   RDW 14.1 03/16/2018 1808    CMP     Component Value Date/Time   NA 139 03/16/2018 1808   K 4.0 03/16/2018 1808   CL 94 (L) 03/16/2018 1808   CO2 33 (H) 03/16/2018 1808   GLUCOSE 268 (H) 03/16/2018 1808   BUN 37 (H) 03/16/2018 1808   CREATININE 2.13 (H) 03/16/2018 1808   CALCIUM 9.7 03/16/2018 1808   PROT 7.9 03/16/2018 1808   ALBUMIN 4.3 03/16/2018 1808   AST 22 03/16/2018 1808   ALT 19 03/16/2018 1808   ALKPHOS 66 03/16/2018 1808   BILITOT 0.9 03/16/2018 1808   GFRNONAA 27 (  L) 03/16/2018 1808   GFRAA 32 (L) 03/16/2018 1808    COAGS No results found for: INR, PTT  I have seen and consented the patient, Ronald Mcdonald. I have answered all of his questions regarding anesthesia. he is appropriately NPO.   Josephina Shih, MD Anesthesia

## 2019-08-10 NOTE — Anesthesia Postprocedure Evaluation (Signed)
Anesthesia Post Note  Patient: Ronald Mcdonald  Procedure(s) Performed: CATARACT EXTRACTION PHACO AND INTRAOCULAR LENS PLACEMENT (IOC) left malyugin diabetic  00:59.0  10.7%  7.09 (Left Eye)  Patient location during evaluation: PACU Anesthesia Type: MAC Level of consciousness: awake and alert Pain management: pain level controlled Vital Signs Assessment: post-procedure vital signs reviewed and stable Respiratory status: spontaneous breathing, nonlabored ventilation, respiratory function stable and patient connected to nasal cannula oxygen Cardiovascular status: stable and blood pressure returned to baseline Postop Assessment: no apparent nausea or vomiting Anesthetic complications: no    Ladoris Lythgoe A  Anwitha Mapes

## 2020-02-11 ENCOUNTER — Other Ambulatory Visit: Payer: Self-pay

## 2020-02-11 ENCOUNTER — Ambulatory Visit (INDEPENDENT_AMBULATORY_CARE_PROVIDER_SITE_OTHER): Payer: Medicare PPO

## 2020-02-11 ENCOUNTER — Ambulatory Visit
Admission: EM | Admit: 2020-02-11 | Discharge: 2020-02-11 | Disposition: A | Discharge status: Home / Self Care | Payer: Medicare PPO | Attending: Emergency Medicine | Admitting: Emergency Medicine

## 2020-02-11 DIAGNOSIS — T07XXXA Unspecified multiple injuries, initial encounter: Secondary | ICD-10-CM | POA: Diagnosis not present

## 2020-02-11 DIAGNOSIS — M79641 Pain in right hand: Secondary | ICD-10-CM | POA: Diagnosis not present

## 2020-02-11 DIAGNOSIS — S20211A Contusion of right front wall of thorax, initial encounter: Secondary | ICD-10-CM | POA: Diagnosis not present

## 2020-02-11 DIAGNOSIS — S63501A Unspecified sprain of right wrist, initial encounter: Secondary | ICD-10-CM

## 2020-02-11 DIAGNOSIS — W101XXA Fall (on)(from) sidewalk curb, initial encounter: Secondary | ICD-10-CM

## 2020-02-11 DIAGNOSIS — M25531 Pain in right wrist: Secondary | ICD-10-CM

## 2020-02-11 MED ORDER — HYDROCODONE-ACETAMINOPHEN 5-325 MG PO TABS: 1.0000 | ORAL_TABLET | Freq: Four times a day (QID) | ORAL | 0 refills | Status: AC | PRN

## 2020-02-11 MED ORDER — MUPIROCIN 2 % EX OINT: 1.0000 "application " | TOPICAL_OINTMENT | Freq: Three times a day (TID) | CUTANEOUS | 0 refills | Status: AC

## 2020-02-11 NOTE — ED Provider Notes (Signed)
[facility]    CSN: [id number] Arrival date & time: 02/11/20  1535      History   Chief Complaint Chief Complaint  Patient presents with  . Rib Injury    HPI Ronald Mcdonald is a 86 y.o. male.   HPI  86 year old male presents today after tripping on a curb and falling forward landing on his right side and on his outstretched hands.  Not having any difficulty breathing.  He states it does not hurt to breathe.  All of his discomfort appears to be on the right side.  He has mild swelling of the fourth metacarpal distally.  He suffered no loss of consciousness.  He is alert and lucent.  He has several abrasions over the lateral aspect of the fifth finger and of the suprapatellar area on the knee.  No bruising noticed on the chest.      Past Medical History:  Diagnosis Date  . Arthritis   . Coronary artery disease   . Diabetes mellitus without complication (HCC)   . GERD (gastroesophageal reflux disease)   . Glaucoma   . Hard of hearing   . History of stomach ulcers   . History of vertigo   . Hypercholesteremia   . Hypertension     There are no problems to display for this patient.   Past Surgical History:  Procedure Laterality Date  . APPENDECTOMY    . CATARACT EXTRACTION W/PHACO Right 07/13/2019   Procedure: CATARACT EXTRACTION PHACO AND INTRAOCULAR LENS PLACEMENT (IOC)  RIGHT diabetes;  Surgeon: Nevada Crane, MD;  Location: [facility];  Service: Ophthalmology;  Laterality: Right;  . CATARACT EXTRACTION W/PHACO Left 08/10/2019   Procedure: CATARACT EXTRACTION PHACO AND INTRAOCULAR LENS PLACEMENT (IOC) left malyugin diabetic  00:59.0  10.7%  7.09;  Surgeon: Nevada Crane, MD;  Location: [facility];  Service: Ophthalmology;  Laterality: Left;  Diabetic - insulin and oral meds  . COLONOSCOPY    . CORONARY ANGIOPLASTY WITH STENT PLACEMENT    . PTCA         Home Medications    Prior to Admission medications   Medication Sig Start  Date End Date Taking? Authorizing Provider  amLODipine (NORVASC) 10 MG tablet Take 10 mg by mouth daily.   Yes [provider]  aspirin 81 MG chewable tablet Chew 81 mg by mouth daily.   Yes [provider]  benazepril (LOTENSIN) 20 MG tablet Take 20 mg by mouth daily.   Yes [provider]  glimepiride (AMARYL) 4 MG tablet Take 4 mg by mouth daily with breakfast.   Yes [provider]  hydrochlorothiazide (HYDRODIURIL) 25 MG tablet Take 25 mg by mouth daily.   Yes [provider]  insulin glargine (LANTUS) 100 UNIT/ML injection Inject 30 Units into the skin daily.   Yes [provider]  insulin lispro (HUMALOG) 100 UNIT/ML injection Inject 6 Units into the skin 3 (three) times daily before meals.   Yes [provider]  metoprolol tartrate (LOPRESSOR) 100 MG tablet Take 100 mg by mouth daily.   Yes [provider]  pantoprazole (PROTONIX) 40 MG tablet Take 40 mg by mouth daily.   Yes [provider]  pravastatin (PRAVACHOL) 80 MG tablet Take 80 mg by mouth daily.   Yes [provider]  tamsulosin (FLOMAX) 0.4 MG CAPS capsule Take 0.4 mg by mouth daily after breakfast.   Yes [provider]  vitamin B-12 (CYANOCOBALAMIN) 1000 MCG tablet Take 1,000  mcg by mouth daily.   Yes [provider]  linagliptin (TRADJENTA) 5 MG TABS tablet Take 5 mg by mouth daily.  02/11/20 Yes [provider]  famotidine (PEPCID) 20 MG tablet Take 1 tablet (20 mg total) by mouth 2 (two) times daily. Patient not taking: Reported on 07/07/2019 03/16/18   Carrie Mew, MD  HYDROcodone-acetaminophen (NORCO/VICODIN) 5-325 MG tablet Take 1-2 tablets by mouth every 6 (six) hours as needed. 02/11/20   Lorin Picket, PA-C  mupirocin ointment (BACTROBAN) 2 % Apply 1 application topically 3 (three) times daily. 02/11/20   Lorin Picket, PA-C  ondansetron (ZOFRAN-ODT) 4 MG disintegrating tablet Take 1 tablet (4 mg  total) by mouth every 8 (eight) hours as needed for nausea or vomiting. Patient not taking: Reported on 07/07/2019 03/16/18   Coral Spikes, DO    Family History Family History  Problem Relation Age of Onset  . Hypertension Mother   . Diabetes Mother   . Heart failure Mother   . Cancer Mother   . Other Father     Social History Social History   Tobacco Use  . Smoking status: Former Research scientist (life sciences)  . Smokeless tobacco: Never Used  Substance Use Topics  . Alcohol use: Yes    Alcohol/week: 2.0 - 3.0 standard drinks    Types: 2 - 3 Standard drinks or equivalent per week  . Drug use: No     Allergies   Testosterone and Simvastatin   Review of Systems Review of Systems  Constitutional: Positive for activity change. Negative for appetite change, chills, diaphoresis, fatigue and fever.  Respiratory: Negative for chest tightness, shortness of breath, wheezing and stridor.   Cardiovascular: Positive for chest pain.  Musculoskeletal: Positive for myalgias.  All other systems reviewed and are negative.    Physical Exam Triage Vital Signs ED Triage Vitals  Enc Vitals Group     BP 02/11/20 1606 (!) 151/67     Pulse Rate 02/11/20 1606 68     Resp 02/11/20 1606 17     Temp 02/11/20 1606 98.7 F (37.1 C)     Temp Source 02/11/20 1606 Oral     SpO2 02/11/20 1606 99 %     Weight 02/11/20 1603 230 lb (104.3 kg)     Height 02/11/20 1603 5\' 11"  (1.803 m)     Head Circumference --      Peak Flow --      Pain Score 02/11/20 1602 8     Pain Loc --      Pain Edu? --      Excl. in [place]? --    No data found.  Updated Vital Signs BP (!) 151/67 (BP Location: Left Arm)   Pulse 68   Temp 98.7 F (37.1 C) (Oral)   Resp 17   Ht 5\' 11"  (1.803 m)   Wt 230 lb (104.3 kg)   SpO2 99%   BMI 32.08 kg/m   Visual Acuity Right Eye Distance:   Left Eye Distance:   Bilateral Distance:    Right Eye Near:   Left Eye Near:    Bilateral Near:     Physical Exam Vitals and nursing note reviewed.   Constitutional:      General: He is not in acute distress.    Appearance: Normal appearance. He is not ill-appearing or toxic-appearing.  HENT:     Head: Normocephalic and atraumatic.  Eyes:     Conjunctiva/sclera: Conjunctivae normal.     Pupils: Pupils are equal,  round, and reactive to light.  Cardiovascular:     Rate and Rhythm: Normal rate and regular rhythm.     Pulses: Normal pulses.     Heart sounds: Normal heart sounds.  Pulmonary:     Effort: Pulmonary effort is normal.     Breath sounds: Normal breath sounds.  Chest:     Chest wall: Tenderness present.  Musculoskeletal:        General: Swelling present. No tenderness. Normal range of motion.     Cervical back: Normal range of motion and neck supple.  Skin:    General: Skin is warm and dry.  Neurological:     General: No focal deficit present.     Mental Status: He is alert and oriented to person, place, and time.  Psychiatric:        Mood and Affect: Mood normal.        Behavior: Behavior normal.        Thought Content: Thought content normal.        Judgment: Judgment normal.      UC Treatments / Results  Labs (all labs ordered are listed, but only abnormal results are displayed) Labs Reviewed - No data to display  EKG   Radiology DG Ribs Unilateral W/Chest Right  Result Date: 02/11/2020 CLINICAL DATA:  Fall, anterior right chest pain EXAM: RIGHT RIBS AND CHEST - 3+ VIEW COMPARISON:  None. FINDINGS: No visible rib fracture. No confluent opacities, effusions or pneumothorax. Heart is normal size. Aortic atherosclerosis. IMPRESSION: No visible rib fracture. No acute cardiopulmonary disease. Electronically Signed   By: Charlett Nose M.D.   On: 02/11/2020 16:38   DG Wrist Complete Right  Result Date: 02/11/2020 CLINICAL DATA:  Larey Seat.  Pain. EXAM: RIGHT WRIST - COMPLETE 3+ VIEW COMPARISON:  None. FINDINGS: No evidence of fracture, dislocation or significant degenerative change. Regional arterial calcification is  noted. IMPRESSION: No acute finding.  Regional arterial calcification. Electronically Signed   By: Paulina Fusi M.D.   On: 02/11/2020 16:52   DG Hand Complete Right  Result Date: 02/11/2020 CLINICAL DATA:  86 year old male with fall. EXAM: RIGHT HAND - COMPLETE 3+ VIEW COMPARISON:  Right wrist radiograph dated 02/11/2020. FINDINGS: There is no acute fracture or dislocation. The bones are osteopenic. There is arthritic changes of the DIP joint of the third digit with mild chronic ulnar subluxation of the joint. Vascular calcifications noted. The soft tissues are unremarkable. IMPRESSION: 1. No acute fracture or dislocation. 2. Osteopenia. Electronically Signed   By: Elgie Collard M.D.   On: 02/11/2020 16:54    Procedures Procedures (including critical care time)  Medications Ordered in UC Medications - No data to display  Initial Impression / Assessment and Plan / UC Course  I have reviewed the triage vital signs and the nursing notes.  Pertinent labs & imaging results that were available during my care of the patient were reviewed by me and considered in my medical decision making (see chart for details).   86 year old male presents after he tripped and fell earlier today.  States that he fell forward landing on his right chest and with his arms extended outward.  He denies any shortness of breath or difficulty breathing.  He was not complaining of hand or wrist pain initially but an x-ray was complaining that he had pain in both his hand and his wrist.  He sustained abrasions over the lateral aspect of the right little finger palm of his right hand and on the suprapatellar  region of his knee.  X-rays did not show any fractures of the ribs or no fractures of the hands or wrist.  These were reviewed with the patient.  Because of the rib pain he was given 10 Vicodin to use for severe pain particularly at nighttime.  Otherwise he will use Tylenol for the pain and discomfort.  Is advised to sleep  in a semi upright position such as in a recliner may be beneficial for helping control pain.  He was told that he must cough and deep breathe frequently in order to avoid pneumonia or pneumonitis.  I instructed him to hold his hand over the area of tenderness to help support the area while he did the breathing exercises.  Given mupirocin ointment for the abrasions to avoid secondary infection.  Is also placed in a right Velcro wrist splint for comfort.  He may use this during activities and I have advised him to sleep with it for the first week.  Should make contact with his primary care physician next week follow-up.  If he develops any shortness of breath or has worsening of his symptoms he should go to the emergency room.       Final Clinical Impressions(s) / UC Diagnoses   Final diagnoses:  Rib contusion, right, initial encounter  Wrist sprain, right, initial encounter  Abrasions of multiple sites     Discharge Instructions     Cough and deep breathe frequently to avoid pneumonia or pneumonitis.  Use the wrist splint for comfort.  Consider using it at nighttime for the first week.  Elevate your hand above the level of your heart sufficiently to control swelling and pain.  Use Tylenol for pain or Vicodin for severe pain particular at nighttime.  Sleep in a recliner for the first couple days may be more beneficial to your pain control.Apply ice 20 minutes out of every 2 hours 4-5 times daily for comfort.     ED Prescriptions    Medication Sig Dispense Auth. Provider   mupirocin ointment (BACTROBAN) 2 % Apply 1 application topically 3 (three) times daily. 22 g Lutricia Feil, PA-C   HYDROcodone-acetaminophen (NORCO/VICODIN) 5-325 MG tablet Take 1-2 tablets by mouth every 6 (six) hours as needed. 10 tablet Lutricia Feil, PA-C     I have reviewed the PDMP during this encounter.   Lutricia Feil, PA-C 02/11/20 1810

## 2020-02-11 NOTE — Discharge Instructions (Signed)
Cough and deep breathe frequently to avoid pneumonia or pneumonitis.  Use the wrist splint for comfort.  Consider using it at nighttime for the first week.  Elevate your hand above the level of your heart sufficiently to control swelling and pain.  Use Tylenol for pain or Vicodin for severe pain particular at nighttime.  Sleep in a recliner for the first couple days may be more beneficial to your pain control.Apply ice 20 minutes out of every 2 hours 4-5 times daily for comfort.

## 2020-02-11 NOTE — ED Triage Notes (Signed)
Pt presents with right-sided pain, mostly in the rib area, after a fall while out walking today. Pt states it hurts to move and when he takes a deep breath. Pt also states his right wrist and right shoulder are also painful.

## 2020-08-09 IMAGING — CR DG WRIST COMPLETE 3+V*R*
4 series · 4 of 4 positions shown · non-contrast
Comparison: None.

CLINICAL DATA: Fell.  Pain.

EXAM:
RIGHT WRIST - COMPLETE 3+ VIEW

[wrist pa]
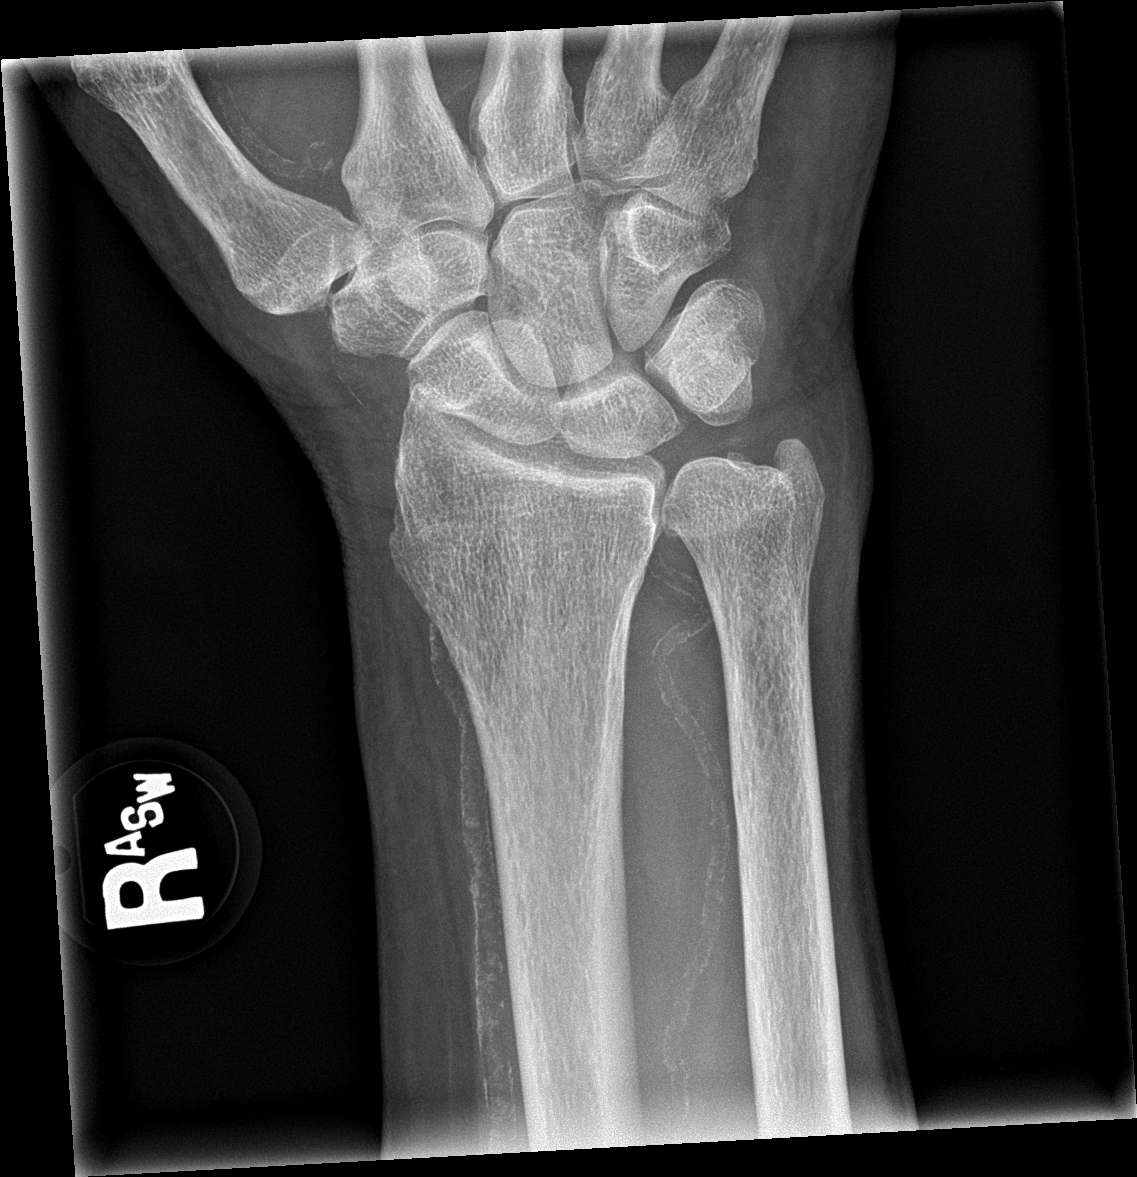

[wrist obl]
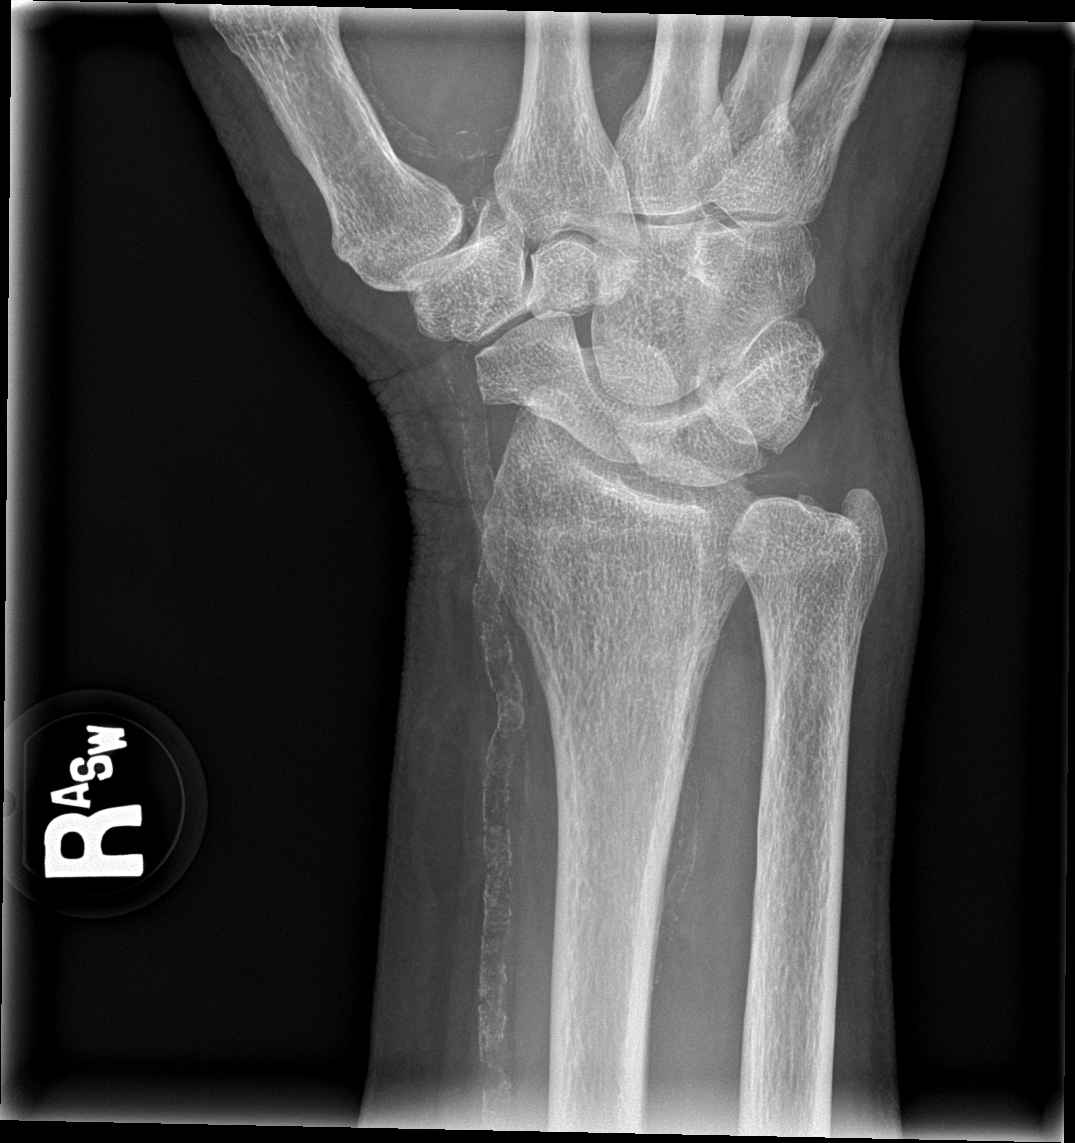

[wrist lat]
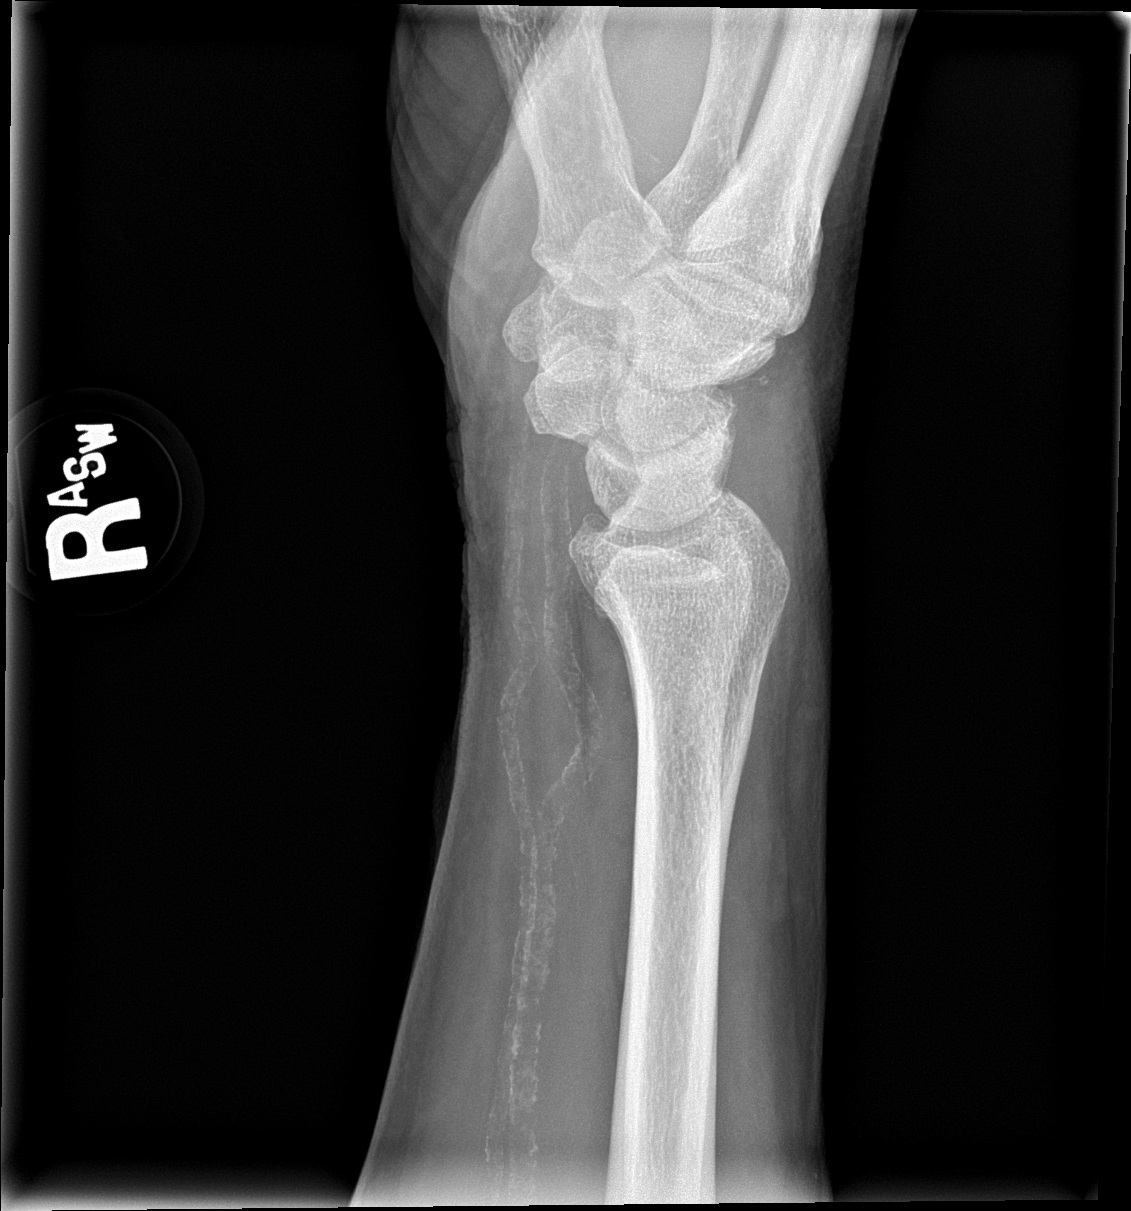

[wrist navicular]
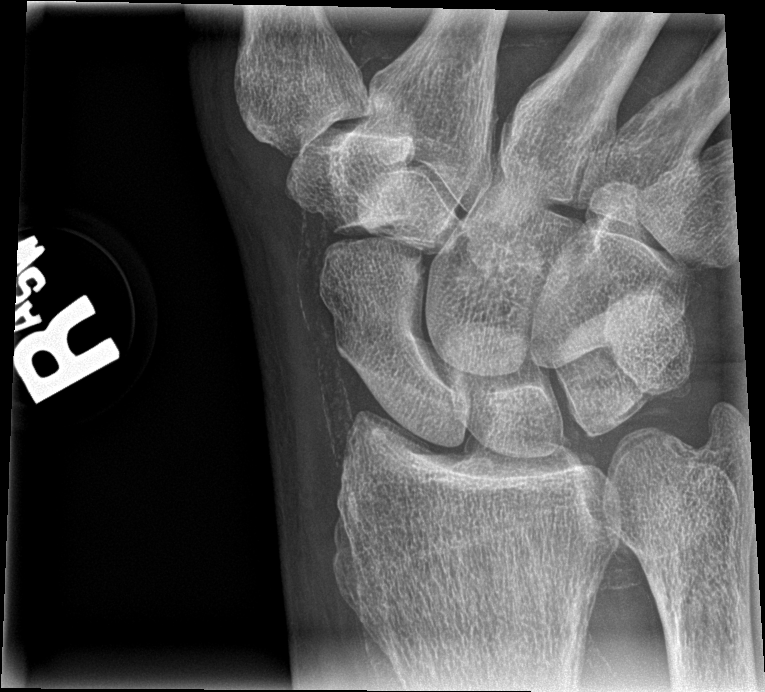

[4 of 4 positions shown; findings below may reference images not displayed]

FINDINGS: No evidence of fracture, dislocation or significant degenerative
change. Regional arterial calcification is noted.
IMPRESSION: No acute finding.  Regional arterial calcification.

## 2021-11-27 ENCOUNTER — Emergency Department
Admission: EM | Admit: 2021-11-27 | Discharge: 2021-11-27 | Disposition: A | Discharge status: Home / Self Care | Payer: Medicare Other | Attending: Emergency Medicine | Admitting: Emergency Medicine

## 2021-11-27 ENCOUNTER — Other Ambulatory Visit: Payer: Self-pay

## 2021-11-27 DIAGNOSIS — T783XXA Angioneurotic edema, initial encounter: Secondary | ICD-10-CM | POA: Diagnosis not present

## 2021-11-27 DIAGNOSIS — Z87891 Personal history of nicotine dependence: Secondary | ICD-10-CM | POA: Insufficient documentation

## 2021-11-27 DIAGNOSIS — Z79899 Other long term (current) drug therapy: Secondary | ICD-10-CM | POA: Insufficient documentation

## 2021-11-27 DIAGNOSIS — I2510 Atherosclerotic heart disease of native coronary artery without angina pectoris: Secondary | ICD-10-CM | POA: Insufficient documentation

## 2021-11-27 DIAGNOSIS — Z7982 Long term (current) use of aspirin: Secondary | ICD-10-CM | POA: Diagnosis not present

## 2021-11-27 DIAGNOSIS — Z794 Long term (current) use of insulin: Secondary | ICD-10-CM | POA: Diagnosis not present

## 2021-11-27 DIAGNOSIS — E119 Type 2 diabetes mellitus without complications: Secondary | ICD-10-CM | POA: Diagnosis not present

## 2021-11-27 DIAGNOSIS — I10 Essential (primary) hypertension: Secondary | ICD-10-CM | POA: Diagnosis not present

## 2021-11-27 DIAGNOSIS — R220 Localized swelling, mass and lump, head: Secondary | ICD-10-CM | POA: Diagnosis present

## 2021-11-27 LAB — BASIC METABOLIC PANEL
Anion gap: 8 (ref 5–15)
BUN: 30 mg/dL — ABNORMAL HIGH (ref 8–23)
CO2: 24 mmol/L (ref 22–32)
Calcium: 9.5 mg/dL (ref 8.9–10.3)
Chloride: 107 mmol/L (ref 98–111)
Creatinine, Ser: 1.95 mg/dL — ABNORMAL HIGH (ref 0.61–1.24)
GFR, Estimated: 33 mL/min — ABNORMAL LOW (ref >60)
Glucose, Bld: 187 mg/dL — ABNORMAL HIGH (ref 70–99)
Potassium: 4.7 mmol/L (ref 3.5–5.1)
Sodium: 139 mmol/L (ref 135–145)

## 2021-11-27 LAB — CBC WITH DIFFERENTIAL/PLATELET
Abs Immature Granulocytes: 0.04 10*3/uL (ref 0.00–0.07)
Basophils Absolute: 0 10*3/uL (ref 0.0–0.1)
Basophils Relative: 1 %
Eosinophils Absolute: 0.2 10*3/uL (ref 0.0–0.5)
Eosinophils Relative: 3 %
HCT: 39.3 % (ref 39.0–52.0)
Hemoglobin: 13 g/dL (ref 13.0–17.0)
Immature Granulocytes: 1 %
Lymphocytes Relative: 24 %
Lymphs Abs: 2.1 10*3/uL (ref 0.7–4.0)
MCH: 27.9 pg (ref 26.0–34.0)
MCHC: 33.1 g/dL (ref 30.0–36.0)
MCV: 84.3 fL (ref 80.0–100.0)
Monocytes Absolute: 0.5 10*3/uL (ref 0.1–1.0)
Monocytes Relative: 6 %
Neutro Abs: 5.8 10*3/uL (ref 1.7–7.7)
Neutrophils Relative %: 65 %
Platelets: 242 10*3/uL (ref 150–400)
RBC: 4.66 MIL/uL (ref 4.22–5.81)
RDW: 12.7 % (ref 11.5–15.5)
WBC: 8.8 10*3/uL (ref 4.0–10.5)
nRBC: 0 % (ref 0.0–0.2)

## 2021-11-27 MED ORDER — DIPHENHYDRAMINE HCL 50 MG/ML IJ SOLN
25.0000 mg | Freq: Once | INTRAMUSCULAR | Status: AC
  Administered 2021-11-27: 11:00:00 25 mg via INTRAVENOUS
  Filled 2021-11-27: qty 1

## 2021-11-27 MED ORDER — METHYLPREDNISOLONE SODIUM SUCC 125 MG IJ SOLR
125.0000 mg | Freq: Once | INTRAMUSCULAR | Status: AC
  Administered 2021-11-27: 11:00:00 125 mg via INTRAVENOUS
  Filled 2021-11-27: qty 2

## 2021-11-27 MED ORDER — FAMOTIDINE IN NACL 20-0.9 MG/50ML-% IV SOLN
20.0000 mg | Freq: Once | INTRAVENOUS | Status: AC
  Administered 2021-11-27: 11:00:00 20 mg via INTRAVENOUS
  Filled 2021-11-27: qty 50

## 2021-11-27 NOTE — ED Triage Notes (Signed)
Presents with some swelling to inside of mouth   right side of tongue  no diff swallowing

## 2021-11-27 NOTE — ED Provider Notes (Signed)
Emergency Medicine Provider Triage Evaluation Note  Ronald Mcdonald , a 87 y.o. male  was evaluated in triage.  Pt complains of presents emergency department with tongue swelling.  No difficulty breathing.  Patient does take a blood pressure medication.  States benazepril  Review of Systems  Positive: Tongue swelling Negative: Chest pain, shortness of breath, difficulty swallowing  Physical Exam  BP (!) 195/158 Comment: ht HTN, takes meds, has not taken meds today   Pulse 74    Temp 98.3 F (36.8 C) (Oral)    Resp 18    Ht 5\' 11"  (1.803 m)    Wt 104 kg    SpO2 95%    BMI 31.98 kg/m  Gen:   Awake, no distress   Resp:  Normal effort  MSK:   Moves extremities without difficulty  Other:  Tongue is grossly swollen  Medical Decision Making  Medically screening exam initiated at 11:17 AM.  Appropriate orders placed.  Ronald Mcdonald was informed that the remainder of the evaluation will be completed by another provider, this initial triage assessment does not replace that evaluation, and the importance of remaining in the ED until their evaluation is complete.  Due to the tongue swelling we will start IV with Solu-Medrol, Pepcid, Benadryl.  Basic labs   Ronald Guthrie, PA-C 11/27/21 1119    11/29/21, MD 11/27/21 1215

## 2021-11-27 NOTE — ED Notes (Signed)
Patient Alert and oriented to baseline. Stable and ambulatory to baseline. Patient verbalized understanding of the discharge instructions.  Patient belongings were taken by the patient.   

## 2021-11-27 NOTE — ED Provider Notes (Signed)
[facility] Emergency Department Provider Note   ____________________________________________   Event Date/Time   First MD Initiated Contact with Patient 11/27/21 1155     (approximate)  I have reviewed the triage vital signs and the nursing notes.   HISTORY  Chief Complaint Facial Swelling    HPI Ronald Mcdonald is a 87 y.o. male with past medical history of hypertension, hyperlipidemia, diabetes, CAD, and GERD who presents to the ED complaining of facial swelling.  Patient reports that when he woke up this morning around 830 he noticed that the right side of his tongue felt swollen.  He states that it has remained swollen since then, is not getting any better or worse.  He has not felt like there is any swelling in the back of his throat and he has not noticed any swelling of his lips.  He has never had similar symptoms in the past, but does report taking an ACE inhibitor for his blood pressure.  He reports some mild difficulty breathing, denies any associated pain with the swelling.        Past Medical History:  Diagnosis Date   Arthritis    Coronary artery disease    Diabetes mellitus without complication (HCC)    GERD (gastroesophageal reflux disease)    Glaucoma    Hard of hearing    History of stomach ulcers    History of vertigo    Hypercholesteremia    Hypertension     There are no problems to display for this patient.   Past Surgical History:  Procedure Laterality Date   APPENDECTOMY     CATARACT EXTRACTION W/PHACO Right 07/13/2019   Procedure: CATARACT EXTRACTION PHACO AND INTRAOCULAR LENS PLACEMENT (IOC)  RIGHT diabetes;  Surgeon: Nevada Crane, MD;  Location: [facility];  Service: Ophthalmology;  Laterality: Right;   CATARACT EXTRACTION W/PHACO Left 08/10/2019   Procedure: CATARACT EXTRACTION PHACO AND INTRAOCULAR LENS PLACEMENT (IOC) left malyugin diabetic  00:59.0  10.7%  7.09;  Surgeon: Nevada Crane, MD;   Location: [facility];  Service: Ophthalmology;  Laterality: Left;  Diabetic - insulin and oral meds   COLONOSCOPY     CORONARY ANGIOPLASTY WITH STENT PLACEMENT     PTCA      Prior to Admission medications   Medication Sig Start Date End Date Taking? Authorizing Provider  amLODipine (NORVASC) 10 MG tablet Take 10 mg by mouth daily.   Yes [provider]  aspirin 81 MG chewable tablet Chew 81 mg by mouth daily.   Yes [provider]  glimepiride (AMARYL) 4 MG tablet Take 4 mg by mouth daily with breakfast.   Yes [provider]  hydrochlorothiazide (HYDRODIURIL) 25 MG tablet Take 25 mg by mouth daily.   Yes [provider]  insulin glargine (LANTUS) 100 UNIT/ML injection Inject 32 Units into the skin daily.   Yes [provider]  insulin lispro (HUMALOG) 100 UNIT/ML injection Inject 10 Units into the skin 3 (three) times daily before meals.   Yes [provider]  metoprolol succinate (TOPROL-XL) 100 MG 24 hr tablet Take 1 tablet by mouth daily. 07/22/20  Yes [provider]  mupirocin ointment (BACTROBAN) 2 % Apply 1 application topically 3 (three) times daily. 02/11/20  Yes Lutricia Feil, PA-C  pantoprazole (PROTONIX) 40 MG tablet Take 40 mg by mouth daily.   Yes [provider]  rosuvastatin (CRESTOR) 20 MG tablet Take 20 mg by mouth daily. 11/13/21  Yes [provider]  sertraline (ZOLOFT) 50 MG tablet Take 50 mg by mouth daily. 11/13/21  Yes [provider]  solifenacin (VESICARE) 5 MG tablet Take 5 mg by mouth daily. 11/13/21  Yes [provider]  tamsulosin (FLOMAX) 0.4 MG CAPS capsule Take 0.4 mg by mouth daily after breakfast.   Yes [provider]  vitamin B-12 (CYANOCOBALAMIN) 1000 MCG tablet Take 1,000 mcg by mouth daily.   Yes [provider]  famotidine (PEPCID) 20 MG tablet Take 1 tablet (20 mg total) by mouth 2 (two) times daily. Patient not taking: Reported  on 07/07/2019 03/16/18   Sharman Cheek, MD  HYDROcodone-acetaminophen (NORCO/VICODIN) 5-325 MG tablet Take 1-2 tablets by mouth every 6 (six) hours as needed. Patient not taking: Reported on 11/27/2021 02/11/20   Ovid Curd P, PA-C  ondansetron (ZOFRAN-ODT) 4 MG disintegrating tablet Take 1 tablet (4 mg total) by mouth every 8 (eight) hours as needed for nausea or vomiting. Patient not taking: Reported on 07/07/2019 03/16/18   Tommie Sams, DO  pravastatin (PRAVACHOL) 80 MG tablet Take 80 mg by mouth daily. Patient not taking: Reported on 11/27/2021    [provider]  linagliptin (TRADJENTA) 5 MG TABS tablet Take 5 mg by mouth daily.  02/11/20  [provider]    Allergies Ace inhibitors, Testosterone, and Simvastatin  Family History  Problem Relation Age of Onset   Hypertension Mother    Diabetes Mother    Heart failure Mother    Cancer Mother    Other Father     Social History Social History   Tobacco Use   Smoking status: Former   Smokeless tobacco: Never  Building services engineer Use: Never used  Substance Use Topics   Alcohol use: Yes    Alcohol/week: 2.0 - 3.0 standard drinks    Types: 2 - 3 Standard drinks or equivalent per week   Drug use: No    Review of Systems  Constitutional: No fever/chills Eyes: No visual changes. ENT: No sore throat.  Positive for tongue swelling. Cardiovascular: Denies chest pain. Respiratory: Positive for shortness of breath. Gastrointestinal: No abdominal pain.  No nausea, no vomiting.  No diarrhea.  No constipation. Genitourinary: Negative for dysuria. Musculoskeletal: Negative for back pain. Skin: Negative for rash. Neurological: Negative for headaches, focal weakness or numbness.  ____________________________________________   PHYSICAL EXAM:  VITAL SIGNS: ED Triage Vitals  Enc Vitals Group     BP 11/27/21 1113 (!) 195/158     Pulse Rate 11/27/21 1113 74     Resp 11/27/21 1113 18     Temp 11/27/21 1113  98.3 F (36.8 C)     Temp Source 11/27/21 1113 Oral     SpO2 11/27/21 1113 95 %     Weight 11/27/21 1108 229 lb 4.5 oz (104 kg)     Height 11/27/21 1108 5\' 11"  (1.803 m)     Head Circumference --      Peak Flow --      Pain Score --      Pain Loc --      Pain Edu? --      Excl. in [place]? --     Constitutional: Alert and oriented. Eyes: Conjunctivae are normal. Head: Atraumatic. Nose: No congestion/rhinnorhea. Mouth/Throat: Mucous membranes are moist.  Right tongue edema noted, no lip edema or posterior oropharyngeal edema noted. Neck: Normal ROM Cardiovascular: Normal rate, regular rhythm. Grossly normal heart sounds.  2+ radial pulses bilaterally. Respiratory: Normal respiratory effort.  No retractions. Lungs CTAB. Gastrointestinal: Soft and nontender. No distention. Genitourinary: deferred Musculoskeletal: No lower extremity tenderness nor edema. Neurologic:  Normal speech and language. No gross focal neurologic deficits are appreciated. Skin:  Skin is warm, dry and intact. No rash noted. Psychiatric: Mood and affect are normal. Speech and behavior are normal.  ____________________________________________   LABS (all labs ordered are listed, but only abnormal results are displayed)  Labs Reviewed  BASIC METABOLIC PANEL - Abnormal; Notable for the following components:      Result Value   Glucose, Bld 187 (*)    BUN 30 (*)    Creatinine, Ser 1.95 (*)    GFR, Estimated 33 (*)    All other components within normal limits  CBC WITH DIFFERENTIAL/PLATELET    PROCEDURES  Procedure(s) performed (including Critical Care):  Procedures   ____________________________________________   INITIAL IMPRESSION / ASSESSMENT AND PLAN / ED COURSE      87 year old male with past medical history of hypertension, hyperlipidemia, diabetes, CAD, and GERD who presents to the ED complaining of right-sided tongue swelling starting this morning.  He reports mild difficulty breathing but is  not in any respiratory distress and is maintaining O2 sats on room air.  There is no posterior oropharyngeal edema on exam, he does have unilateral tongue swelling consistent with ACE inhibitor induced angioedema.  He was given Solu-Medrol, Benadryl, and Pepcid IV and we will closely monitor for any worsening of the angioedema.  On reassessment, patient's angioedema has been gradually improving.  He states his tongue feels almost back to normal and he is not having any difficulty breathing whatsoever.  Tongue does appear to have reduced to approximately normal size and he continues to be without any posterior oropharyngeal edema.  He is appropriate for discharge home and was counseled on multiple occasions to stop benazepril and not to take any ACE inhibitor's.  He was counseled to follow-up with his PCP for recheck of his blood pressure and to return to the ED for new worsening symptoms.  Patient agrees with plan.      ____________________________________________   FINAL CLINICAL IMPRESSION(S) / ED DIAGNOSES  Final diagnoses:  Angioedema, initial encounter     ED Discharge Orders     None        Note:  This document was prepared using Dragon voice recognition software and may include unintentional dictation errors.    Chesley Noon, MD 11/27/21 1520

## 2021-11-27 NOTE — Discharge Instructions (Signed)
You should stop taking your benazepril immediately as this is likely the cause of your tongue swelling today.  You will need to follow-up with your primary care doctor for recheck of your blood pressure and any possible medication change to address elevated blood pressure.  You should never take an ACE inhibitor again, as these medications could cause a reaction similar to what you experienced today.  Please return to the ER for any worsening swelling or difficulty breathing.
